# Patient Record
Sex: Male | Born: 1955
Health system: Southern US, Community
[De-identification: ages and names within clinical notes are randomized; demographics above are authoritative.]

## PROBLEM LIST (undated history)

## (undated) ENCOUNTER — Ambulatory Visit: Admission: EM

## (undated) DIAGNOSIS — R011 Cardiac murmur, unspecified: Secondary | ICD-10-CM

## (undated) DIAGNOSIS — Z87438 Personal history of other diseases of male genital organs: Secondary | ICD-10-CM

## (undated) DIAGNOSIS — F419 Anxiety disorder, unspecified: Secondary | ICD-10-CM

## (undated) DIAGNOSIS — C61 Malignant neoplasm of prostate: Secondary | ICD-10-CM

## (undated) DIAGNOSIS — I10 Essential (primary) hypertension: Secondary | ICD-10-CM

## (undated) HISTORY — PX: PROSTATE BIOPSY: SHX241

## (undated) HISTORY — DX: Personal history of other diseases of male genital organs: Z87.438

## (undated) HISTORY — DX: Essential (primary) hypertension: I10

## (undated) HISTORY — PX: TONSILLECTOMY: SUR1361

---

## 1979-04-10 HISTORY — PX: INGUINAL HERNIA REPAIR: SUR1180

## 2004-10-05 ENCOUNTER — Emergency Department (HOSPITAL_COMMUNITY): Admission: EM | Admit: 2004-10-05 | Discharge: 2004-10-05 | Payer: Self-pay | Admitting: Emergency Medicine

## 2013-11-09 ENCOUNTER — Encounter: Payer: Self-pay | Admitting: Radiation Oncology

## 2013-11-09 DIAGNOSIS — Z8546 Personal history of malignant neoplasm of prostate: Secondary | ICD-10-CM | POA: Insufficient documentation

## 2013-11-09 DIAGNOSIS — C61 Malignant neoplasm of prostate: Secondary | ICD-10-CM | POA: Insufficient documentation

## 2013-11-09 NOTE — Progress Notes (Signed)
GU Location of Tumor / Histology: T1cNxMx Gleason 6 prostate cancer  If Prostate Cancer, Gleason Score is (3 + 3) and PSA is (9.6)  Patient presented in 2008 with an elevated PSA of 4.7.  Biopsies of prostate (if applicable) revealed:     Past/Anticipated interventions by urology, if any: discussed RALP, EXRT or Seeds. Patient most interest in seeds.   Past/Anticipated interventions by medical oncology, if any: None  Weight changes, if any: None noted  Bowel/Bladder complaints, if any: IPSS 2, Volume 56 cc, mild erectile dysfunction and minimal voiding symptoms   Nausea/Vomiting, if any: None noted  Pain issues, if any:  None noted  SAFETY ISSUES:  Prior radiation? NO  Pacemaker/ICD? NO  Possible current pregnancy? NO  Is the patient on methotrexate? NO  Current Complaints / other details:  58 year old male. Married. Education officer, museum.

## 2013-11-11 ENCOUNTER — Encounter: Payer: Self-pay | Admitting: Radiation Oncology

## 2013-11-11 NOTE — Progress Notes (Signed)
Radiation Oncology         [phone]) [phone] ________________________________  Initial outpatient Consultation  Name: Alan Johnson MRN: [id number]  Date: 11/12/2013  DOB: 07-Jan-1956  CC:No primary provider on file.  Irine Seal, MD   REFERRING PHYSICIAN: Irine Seal, MD  DIAGNOSIS: 58 y.o. gentleman with stage T1c adenocarcinoma of the prostate with a Gleason's score of 3+3 and a PSA of 9.6  HISTORY OF PRESENT ILLNESS::Alan Johnson is a 58 y.o. gentleman.  He was noted to have an elevated PSA of 5.0 in 2008 by his primary care physician, Dr. Thea Silversmith.  Accordingly, he was referred for evaluation in urology Dr. Jeffie Pollock who recommended biopsy, but the patient did not return for that. His PSA was elevated again at 9.6 with Dr. Thea Silversmith and the patient returned to Dr. Jeffie Pollock on 06/18/13,  digital rectal examination was performed at that time revealing a 3+ gland without nodules.  The patient proceeded to transrectal ultrasound with 12 biopsies of the prostate on 22315.  The prostate volume measured 56 cc.  Out of 12 core biopsies, 8 were positive.  The maximum Gleason score was 3+3, and this was seen in the distribution displayed in the graphic below:   The patient reviewed the biopsy results with his urologist and he has kindly been referred today for discussion of potential radiation treatment options.  PREVIOUS RADIATION THERAPY: No  PAST MEDICAL HISTORY:  has a past medical history of Hypertrophy of prostate without urinary obstruction and other lower urinary tract symptoms (LUTS); Elevated prostate specific antigen (PSA); Malignant neoplasm of prostate; Anxiety state, unspecified; Hematuria; cardiac murmur; Hypertension; and History of prostatitis.    PAST SURGICAL HISTORY: Past Surgical History  Procedure Laterality Date  . Inguinal hernia repair    . Tonsillectomy    . Prostate biopsy      FAMILY HISTORY: family history includes Stroke in his father.  SOCIAL HISTORY:  reports that he has never  smoked. He has never used smokeless tobacco. He reports that he drinks alcohol. He reports that he does not use illicit drugs.  ALLERGIES: Review of patient's allergies indicates no known allergies.  MEDICATIONS:  Current Outpatient Prescriptions  Medication Sig Dispense Refill  . ALPRAZolam (XANAX) 1 MG tablet Take 1 mg by mouth at bedtime as needed for anxiety.      . AMLODIPINE BESYLATE PO Take 5 mg by mouth.       Marland Kitchen aspirin 81 MG tablet Take 81 mg by mouth daily.      . cholecalciferol (VITAMIN D) 1000 UNITS tablet Take 1,000 Units by mouth daily.      Marland Kitchen triamcinolone cream (KENALOG) 0.1 % Apply 1 application topically 2 (two) times daily.       No current facility-administered medications for this encounter.    REVIEW OF SYSTEMS:  A 15 point review of systems is documented in the electronic medical record. This was obtained by the nursing staff. However, I reviewed this with the patient to discuss relevant findings and make appropriate changes.  A comprehensive review of systems was negative..  The patient completed an IPSS and IIEF questionnaire.  His IPSS score was 3 indicating mild urinary outflow obstructive symptoms.  He indicated that his erectile function is able to complete sexual activity on almost all attempts.   PHYSICAL EXAM: This patient is in no acute distress.  He is alert and oriented.   weight is 217 lb 3.2 oz (98.521 kg). His blood pressure is 152/96 and his pulse is 62.  His respiration is 16.  He exhibits no respiratory distress or labored breathing.  He appears neurologically intact.  His mood is pleasant.  His affect is appropriate.  Please note the digital rectal exam findings described above.  KPS = 100   LABORATORY DATA:  PSA in HPI   RADIOGRAPHY: No results found.    IMPRESSION: This gentleman is a 58 y.o. gentleman with stage T1c adenocarcinoma of the prostate with a Gleason's score of 3+3 and a PSA of 9.6.  His T-Stage, Gleason's Score, and PSA put him into  the favorable risk group.  Accordingly he is eligible for a variety of potential treatment options including active surveillance, radical prostatectomy, external beam radiotherapy, or prostate seed implant.  PLAN: Today I reviewed the findings and workup thus far.  We discussed the natural history of prostate cancer.  We reviewed the the implications of T-stage, Gleason's Score, and PSA on decision-making and outcomes in prostate cancer.    We spent time discussing active surveillance in terms of ongoing PSA surveillance and repeat prostate biopsies. I explained this would be a reasonable option for him.  We discussed radiation treatment in the management of prostate cancer with regard to the logistics and delivery of external beam radiation treatment as well as the logistics and delivery of prostate brachytherapy.  We compared and contrasted each of these approaches and also compared these against prostatectomy.  The patient expressed interest in prostate brachytherapy.  I filled out a patient counseling form for him with relevant treatment diagrams and we retained a copy for our records.   The patient would like to proceed with prostate brachytherapy.  I will share my findings with Dr. Jeffie Pollock and move forward with scheduling the procedure in the near future.     I enjoyed meeting with him today, and will look forward to participating in the care of this very nice gentleman.  I spent 60 minutes face to face with the patient and more than 50% of that time was spent in counseling and/or coordination of care.   ------------------------------------------------  Sheral Apley. Tammi Klippel, M.D.

## 2013-11-12 ENCOUNTER — Ambulatory Visit
Admission: RE | Admit: 2013-11-12 | Discharge: 2013-11-12 | Disposition: A | Discharge status: Home / Self Care | Payer: BC Managed Care – PPO | Source: Ambulatory Visit | Attending: Radiation Oncology | Admitting: Radiation Oncology

## 2013-11-12 ENCOUNTER — Encounter: Payer: Self-pay | Admitting: Radiation Oncology

## 2013-11-12 VITALS — BP 152/96 | HR 62 | Resp 16 | Wt 217.2 lb

## 2013-11-12 DIAGNOSIS — I10 Essential (primary) hypertension: Secondary | ICD-10-CM | POA: Insufficient documentation

## 2013-11-12 DIAGNOSIS — C61 Malignant neoplasm of prostate: Secondary | ICD-10-CM

## 2013-11-12 DIAGNOSIS — N400 Enlarged prostate without lower urinary tract symptoms: Secondary | ICD-10-CM | POA: Insufficient documentation

## 2013-11-12 DIAGNOSIS — Z79899 Other long term (current) drug therapy: Secondary | ICD-10-CM | POA: Insufficient documentation

## 2013-11-12 NOTE — Progress Notes (Signed)
IPSS 3. Reports nocturia x1 only if he "drinks a lot before bed." Denies hematuria, urgency or incontinence. States, "I wouldn't even know I had an issue with my prostate if it wasn't for that PSA test." Denies night sweats or weight loss. Denies fatigue. Reports mild ED.

## 2013-11-12 NOTE — Progress Notes (Signed)
See progress note under physician encounter. 

## 2013-11-13 ENCOUNTER — Telehealth: Payer: Self-pay | Admitting: *Deleted

## 2013-11-13 NOTE — Telephone Encounter (Signed)
CALLED PATIENT TO INFORM OF PRE-SEED SCAN FOR 11-16-13, SPOKE WITH PATIENT AND HE IS AWARE OF THIS APPT.

## 2013-11-13 NOTE — Telephone Encounter (Signed)
CALLED PATIENT TO INFORM OF APPT. ON 11-16-13 @ 3:30 PM, NO ANSWER, WILL CALL LATER.

## 2013-11-14 NOTE — Addendum Note (Signed)
Encounter addended by: Heath Tesler Mintz Sowmya Partridge, RN on: 11/14/2013  7:07 PM<BR>     Documentation filed: Charges VN

## 2013-11-15 ENCOUNTER — Other Ambulatory Visit: Payer: Self-pay | Admitting: Urology

## 2013-11-15 ENCOUNTER — Telehealth: Payer: Self-pay | Admitting: *Deleted

## 2013-11-15 NOTE — Progress Notes (Signed)
  Radiation Oncology         [phone] ________________________________  Name: Alan Johnson MRN: [id number]  Date: 11/16/2013  DOB: 09-Nov-1955  SIMULATION AND TREATMENT PLANNING NOTE PUBIC ARCH STUDY  CC:No primary provider on file.  Irine Seal, MD  DIAGNOSIS: 58 y.o. gentleman with stage T1c adenocarcinoma of the prostate with a Gleason's score of 3+3 and a PSA of 9.6  COMPLEX SIMULATION:  The patient presented today for evaluation for possible prostate seed implant. He was brought to the radiation planning suite and placed supine on the CT couch. A 3-dimensional image study set was obtained in upload to the planning computer. There, on each axial slice, I contoured the prostate gland. Then, using three-dimensional radiation planning tools I reconstructed the prostate in view of the structures from the transperineal needle pathway to assess for possible pubic arch interference. In doing so, I did not appreciate any pubic arch interference. Also, the patient's prostate volume was estimated based on the drawn structure. The patient had transrectal ultrasound on 10/01/13 and the prostate volume measured 56 cc The estimated CT volume today was 48 cc.  Given the pubic arch appearance and prostate volume, patient remains a good candidate to proceed with prostate seed implant. Today, he freely provided informed written consent to proceed.    PLAN: The patient will undergo prostate seed implant.   ________________________________  Sheral Apley. Tammi Klippel, M.D.

## 2013-11-15 NOTE — Telephone Encounter (Signed)
Called patient to remind of appt. For 11-16-13, confirmed appt. With patient.

## 2013-11-16 ENCOUNTER — Ambulatory Visit
Admission: RE | Admit: 2013-11-16 | Discharge: 2013-11-16 | Disposition: A | Discharge status: Home / Self Care | Payer: BC Managed Care – PPO | Source: Ambulatory Visit | Attending: Radiation Oncology | Admitting: Radiation Oncology

## 2013-11-16 DIAGNOSIS — C61 Malignant neoplasm of prostate: Secondary | ICD-10-CM

## 2013-11-20 ENCOUNTER — Telehealth: Payer: Self-pay | Admitting: *Deleted

## 2013-11-20 NOTE — Telephone Encounter (Signed)
Called patient to remind of chest x-ray and EKG for 11-21-13, no answer will call later.

## 2013-11-21 ENCOUNTER — Ambulatory Visit (HOSPITAL_BASED_OUTPATIENT_CLINIC_OR_DEPARTMENT_OTHER)
Admission: RE | Admit: 2013-11-21 | Discharge: 2013-11-21 | Disposition: A | Discharge status: Home / Self Care | Payer: BC Managed Care – PPO | Source: Ambulatory Visit | Attending: Urology | Admitting: Urology

## 2013-11-21 ENCOUNTER — Encounter (HOSPITAL_BASED_OUTPATIENT_CLINIC_OR_DEPARTMENT_OTHER)
Admission: RE | Admit: 2013-11-21 | Discharge: 2013-11-21 | Disposition: A | Discharge status: Home / Self Care | Payer: BC Managed Care – PPO | Source: Ambulatory Visit | Attending: Urology | Admitting: Urology

## 2013-11-21 DIAGNOSIS — Z01810 Encounter for preprocedural cardiovascular examination: Secondary | ICD-10-CM | POA: Insufficient documentation

## 2013-11-21 DIAGNOSIS — Z01818 Encounter for other preprocedural examination: Secondary | ICD-10-CM | POA: Insufficient documentation

## 2013-11-21 DIAGNOSIS — Z87891 Personal history of nicotine dependence: Secondary | ICD-10-CM | POA: Insufficient documentation

## 2013-12-19 ENCOUNTER — Telehealth: Payer: Self-pay | Admitting: *Deleted

## 2013-12-19 LAB — CBC
HCT: 41.6 % (ref 39.0–52.0)
Hemoglobin: 14.6 g/dL (ref 13.0–17.0)
MCH: 31.1 pg (ref 26.0–34.0)
MCHC: 35.1 g/dL (ref 30.0–36.0)
MCV: 88.5 fL (ref 78.0–100.0)
PLATELETS: 227 10*3/uL (ref 150–400)
RBC: 4.7 MIL/uL (ref 4.22–5.81)
RDW: 12.8 % (ref 11.5–15.5)
WBC: 6.5 10*3/uL (ref 4.0–10.5)

## 2013-12-19 LAB — PROTIME-INR
INR: 0.93 (ref 0.00–1.49)
Prothrombin Time: 12.3 seconds (ref 11.6–15.2)

## 2013-12-19 LAB — COMPREHENSIVE METABOLIC PANEL
ALBUMIN: 4.1 g/dL (ref 3.5–5.2)
ALK PHOS: 68 U/L (ref 39–117)
ALT: 38 U/L (ref 0–53)
AST: 31 U/L (ref 0–37)
BUN: 13 mg/dL (ref 6–23)
CO2: 25 mEq/L (ref 19–32)
Calcium: 9.8 mg/dL (ref 8.4–10.5)
Chloride: 102 mEq/L (ref 96–112)
Creatinine, Ser: 1.1 mg/dL (ref 0.50–1.35)
GFR calc Af Amer: 86 mL/min — ABNORMAL LOW (ref >90)
GFR calc non Af Amer: 74 mL/min — ABNORMAL LOW (ref >90)
Glucose, Bld: 105 mg/dL — ABNORMAL HIGH (ref 70–99)
POTASSIUM: 4 meq/L (ref 3.7–5.3)
SODIUM: 141 meq/L (ref 137–147)
Total Bilirubin: 0.4 mg/dL (ref 0.3–1.2)
Total Protein: 7.8 g/dL (ref 6.0–8.3)

## 2013-12-19 LAB — APTT: aPTT: 34 seconds (ref 24–37)

## 2013-12-19 NOTE — Telephone Encounter (Signed)
CALLED PATIENT TO REMIND OF LAB FOR 12-20-13, SPOKE WITH PATIENT AND HE IS AWARE OF THIS APPT.

## 2013-12-21 ENCOUNTER — Encounter (HOSPITAL_BASED_OUTPATIENT_CLINIC_OR_DEPARTMENT_OTHER): Payer: Self-pay | Admitting: *Deleted

## 2013-12-24 ENCOUNTER — Encounter (HOSPITAL_BASED_OUTPATIENT_CLINIC_OR_DEPARTMENT_OTHER): Payer: Self-pay | Admitting: *Deleted

## 2013-12-24 NOTE — Progress Notes (Signed)
NPO AFTER MN. ARRIVE AT 0600. CURRENT LAB RESULTS, EKG AND CXR IN EPIC AND CHART.  WILL DO FLEET ENEMA AM DOS.

## 2013-12-26 ENCOUNTER — Telehealth: Payer: Self-pay | Admitting: *Deleted

## 2013-12-26 NOTE — H&P (Signed)
eason For Visit  Seen today for a pre-op H&P.   Active Problems Problems   1. Pre-op exam (V72.84)   Assessed By: Jimmey Ralph (Urology); Last Assessed: 19 Dec 2013  2. Prostate cancer (185)   Assessed By: Jimmey Ralph (Urology); Last Assessed: 19 Dec 2013  History of Present Illness        58 YO male patient of Dr. Ralene Muskrat seen tody for a pre-op H&P.   GU HX:  March 07/2014 prostate biopsy done for a rising PSA of 9.6.  His PSA was 4.7 in 2008.   He was found to have a T1c Nx Mx Gleason 6 prostate cancer in 5/6 left cores with 5-10% involvement and 3/6 right cores with 5-40% involvement.  He has some associated HGPIN and Atypia.   His IPSS is 2 and his SHIM is 22.   He has a CAPRA score of 3 and has low to intermediate risk disease.   His prostate volume is 56cc.   Interval HX:  Today states he is doing well. Denies f/c, cough, SOB, or CP.   Past Medical History Problems   1. History of Anxiety (300.00)  2. History of Hematuria (599.7)  3. History of cardiac murmur (V12.59)  4. History of hypertension (V12.59)  5. History of prostatitis (V13.89)  Surgical History Problems   1. History of Inguinal Hernia Repair  2. History of Tonsillectomy  Current Meds  1. ALPRAZolam TABS;  Therapy: (Recorded:10Nov2014) to Recorded  2. AmLODIPine Besylate 5 MG Oral Tablet;  Therapy: (Recorded:10Nov2014) to Recorded  3. Kenalog 0.1 % CREA;  Therapy: (Recorded:10Nov2014) to Recorded  Allergies Medication   1. No Known Drug Allergies  Family History Problems   1. Family history of Death : Father  2. Family history of Death In The Family Father : Father   CVA     Age of death was 53  3. Family history of Family Health Status Number Of Children   Two sons and one daughter.  4. Family history of Stroke Syndrome (V17.1) : Father  Social History Problems   1. Alcohol use  2. Caffeine Use   One 8 oz drink per day.  3. Married  4. Never smoker  5. Number of children  6.  Occupation:   Education officer, [employer]  Review of Systems Genitourinary, constitutional, skin, eye, otolaryngeal, hematologic/lymphatic, cardiovascular, pulmonary, endocrine, musculoskeletal, gastrointestinal, neurological and psychiatric system(s) were reviewed and pertinent findings if present are noted.    Vitals Vital Signs [Data Includes: Last 1 Day]  Recorded: [id number] 03:55PM  Height: 5 ft 7 in Weight: 191 lb  BMI Calculated: 29.92 BSA Calculated: 1.98 Blood Pressure: 153 / 99 Temperature: 98.3 F Heart Rate: 68  Physical Exam Constitutional: Well nourished and well developed . No acute distress. The patient appears well hydrated.  ENT:. The ears and nose are normal in appearance.  Neck: The appearance of the neck is normal.  Pulmonary: No respiratory distress.  Cardiovascular: Heart rate and rhythm are normal.  Abdomen: The abdomen is obese. The abdomen is soft and nontender. No suprapubic tenderness.  Skin: Normal skin turgor and normal skin color and pigmentation.  Neuro/Psych:. Mood and affect are appropriate.    Results/Data  The following clinical lab reports were reviewed:  UA- negative. Selected Results  UA With REFLEX [id number] 04:03PM Jimmey Ralph  SPECIMEN TYPE: CLEAN CATCH   Test Name Result Flag Reference  COLOR YELLOW  YELLOW  APPEARANCE CLEAR  CLEAR  SPECIFIC GRAVITY 1.015  1.005-1.030  pH 7.0  5.0-8.0  GLUCOSE NEG mg/dL  NEG  BILIRUBIN NEG  NEG  KETONE NEG mg/dL  NEG  BLOOD TRACE A NEG  PROTEIN NEG mg/dL  NEG  UROBILINOGEN 1 mg/dL  0.0-1.0  NITRITE NEG  NEG  LEUKOCYTE ESTERASE NEG  NEG  SQUAMOUS EPITHELIAL/HPF RARE  RARE  WBC NONE SEEN WBC/hpf  <3  RBC 0-2 RBC/hpf  <3  BACTERIA NONE SEEN  RARE  CRYSTALS NONE SEEN  NONE SEEN  CASTS NONE SEEN  NONE SEEN   Assessment Assessed   1. Pre-op exam (V72.84)  2. Prostate cancer (185)  Plan   He is cleared to proceed with upcoming seen implantation with Dr. Jeffie Pollock. Instructed to contact office if he has  any acute changes ie temp >`100.5, cough/congestion, SOB, or CP

## 2013-12-26 NOTE — Anesthesia Preprocedure Evaluation (Addendum)
Anesthesia Evaluation  Patient identified by MRN, date of birth, ID band Patient awake    Reviewed: Allergy & Precautions, H&P , NPO status , Patient's Chart, lab work & pertinent test results  Airway Mallampati: II TM Distance: >3 FB Neck ROM: full    Dental no notable dental hx. (+) Teeth Intact, Dental Advisory Given   Pulmonary neg pulmonary ROS,  breath sounds clear to auscultation  Pulmonary exam normal       Cardiovascular Exercise Tolerance: Good hypertension, Pt. on medications Rhythm:regular Rate:Normal     Neuro/Psych negative neurological ROS  negative psych ROS   GI/Hepatic negative GI ROS, Neg liver ROS,   Endo/Other  negative endocrine ROS  Renal/GU negative Renal ROS  negative genitourinary   Musculoskeletal   Abdominal   Peds  Hematology negative hematology ROS (+)   Anesthesia Other Findings   Reproductive/Obstetrics negative OB ROS                         Anesthesia Physical Anesthesia Plan  ASA: II  Anesthesia Plan: General   Post-op Pain Management:    Induction: Intravenous  Airway Management Planned: LMA  Additional Equipment:   Intra-op Plan:   Post-operative Plan:   Informed Consent: I have reviewed the patients History and Physical, chart, labs and discussed the procedure including the risks, benefits and alternatives for the proposed anesthesia with the patient or authorized representative who has indicated his/her understanding and acceptance.   Dental Advisory Given  Plan Discussed with: CRNA and Surgeon  Anesthesia Plan Comments:         Anesthesia Quick Evaluation

## 2013-12-26 NOTE — Telephone Encounter (Signed)
Called patient to remind of implant on 12-27-13, spoke with patient and he is aware of this procedure.

## 2013-12-27 ENCOUNTER — Encounter (HOSPITAL_BASED_OUTPATIENT_CLINIC_OR_DEPARTMENT_OTHER)
Admission: RE | Disposition: A | Discharge status: Home / Self Care | Payer: Self-pay | Source: Ambulatory Visit | Attending: Urology

## 2013-12-27 ENCOUNTER — Ambulatory Visit (HOSPITAL_COMMUNITY): Payer: BC Managed Care – PPO

## 2013-12-27 ENCOUNTER — Ambulatory Visit (HOSPITAL_BASED_OUTPATIENT_CLINIC_OR_DEPARTMENT_OTHER): Payer: BC Managed Care – PPO | Admitting: Anesthesiology

## 2013-12-27 ENCOUNTER — Encounter (HOSPITAL_BASED_OUTPATIENT_CLINIC_OR_DEPARTMENT_OTHER): Payer: Self-pay | Admitting: *Deleted

## 2013-12-27 ENCOUNTER — Encounter (HOSPITAL_BASED_OUTPATIENT_CLINIC_OR_DEPARTMENT_OTHER): Payer: BC Managed Care – PPO | Admitting: Anesthesiology

## 2013-12-27 ENCOUNTER — Ambulatory Visit (HOSPITAL_BASED_OUTPATIENT_CLINIC_OR_DEPARTMENT_OTHER)
Admission: RE | Admit: 2013-12-27 | Discharge: 2013-12-27 | Disposition: A | Discharge status: Home / Self Care | Payer: BC Managed Care – PPO | Source: Ambulatory Visit | Attending: Urology | Admitting: Urology

## 2013-12-27 DIAGNOSIS — R011 Cardiac murmur, unspecified: Secondary | ICD-10-CM | POA: Insufficient documentation

## 2013-12-27 DIAGNOSIS — Z6829 Body mass index (BMI) 29.0-29.9, adult: Secondary | ICD-10-CM | POA: Insufficient documentation

## 2013-12-27 DIAGNOSIS — F411 Generalized anxiety disorder: Secondary | ICD-10-CM | POA: Insufficient documentation

## 2013-12-27 DIAGNOSIS — E669 Obesity, unspecified: Secondary | ICD-10-CM | POA: Insufficient documentation

## 2013-12-27 DIAGNOSIS — Z79899 Other long term (current) drug therapy: Secondary | ICD-10-CM | POA: Insufficient documentation

## 2013-12-27 DIAGNOSIS — I10 Essential (primary) hypertension: Secondary | ICD-10-CM | POA: Insufficient documentation

## 2013-12-27 DIAGNOSIS — C61 Malignant neoplasm of prostate: Secondary | ICD-10-CM | POA: Insufficient documentation

## 2013-12-27 HISTORY — DX: Anxiety disorder, unspecified: F41.9

## 2013-12-27 HISTORY — DX: Cardiac murmur, unspecified: R01.1

## 2013-12-27 HISTORY — DX: Malignant neoplasm of prostate: C61

## 2013-12-27 HISTORY — PX: RADIOACTIVE SEED IMPLANT: SHX5150

## 2013-12-27 SURGERY — INSERTION, RADIATION SOURCE, PROSTATE
Anesthesia: General | Site: Prostate

## 2013-12-27 MED ORDER — BELLADONNA ALKALOIDS-OPIUM 16.2-60 MG RE SUPP
RECTAL | Status: AC
Start: 2013-12-27 — End: 2013-12-27
  Filled 2013-12-27: qty 1

## 2013-12-27 MED ORDER — CIPROFLOXACIN HCL 500 MG PO TABS: 500.0000 mg | ORAL_TABLET | Freq: Two times a day (BID) | ORAL | Status: DC

## 2013-12-27 MED ORDER — TAMSULOSIN HCL 0.4 MG PO CAPS: 0.4000 mg | ORAL_CAPSULE | Freq: Every day | ORAL | Status: AC

## 2013-12-27 MED ORDER — FENTANYL CITRATE 0.05 MG/ML IJ SOLN
25.0000 ug | INTRAMUSCULAR | Status: DC | PRN
  Filled 2013-12-27: qty 1

## 2013-12-27 MED ORDER — DOCUSATE SODIUM 100 MG PO CAPS: 100.0000 mg | ORAL_CAPSULE | Freq: Two times a day (BID) | ORAL | Status: DC

## 2013-12-27 MED ORDER — LIDOCAINE HCL (CARDIAC) 20 MG/ML IV SOLN
INTRAVENOUS | Status: DC | PRN
  Administered 2013-12-27: 60 mg via INTRAVENOUS

## 2013-12-27 MED ORDER — IOHEXOL 350 MG/ML SOLN
INTRAVENOUS | Status: DC | PRN
  Administered 2013-12-27: 7 mL

## 2013-12-27 MED ORDER — PROPOFOL 10 MG/ML IV BOLUS
INTRAVENOUS | Status: DC | PRN
  Administered 2013-12-27: 180 mg via INTRAVENOUS

## 2013-12-27 MED ORDER — MIDAZOLAM HCL 5 MG/5ML IJ SOLN
INTRAMUSCULAR | Status: DC | PRN
  Administered 2013-12-27: 2 mg via INTRAVENOUS

## 2013-12-27 MED ORDER — CIPROFLOXACIN IN D5W 400 MG/200ML IV SOLN
400.0000 mg | INTRAVENOUS | Status: AC
  Administered 2013-12-27: 400 mg via INTRAVENOUS
  Filled 2013-12-27: qty 200

## 2013-12-27 MED ORDER — ONDANSETRON HCL 4 MG/2ML IJ SOLN
INTRAMUSCULAR | Status: DC | PRN
Start: 2013-12-27 — End: 2013-12-27
  Administered 2013-12-27: 4 mg via INTRAVENOUS

## 2013-12-27 MED ORDER — LACTATED RINGERS IV SOLN
INTRAVENOUS | Status: DC
  Administered 2013-12-27 (×2): via INTRAVENOUS
  Filled 2013-12-27: qty 1000

## 2013-12-27 MED ORDER — STERILE WATER FOR IRRIGATION IR SOLN
Status: DC | PRN
  Administered 2013-12-27: 3 mL

## 2013-12-27 MED ORDER — MIDAZOLAM HCL 2 MG/2ML IJ SOLN
INTRAMUSCULAR | Status: AC
  Filled 2013-12-27: qty 2

## 2013-12-27 MED ORDER — LACTATED RINGERS IV SOLN
INTRAVENOUS | Status: DC
  Filled 2013-12-27: qty 1000

## 2013-12-27 MED ORDER — DEXAMETHASONE SODIUM PHOSPHATE 4 MG/ML IJ SOLN
INTRAMUSCULAR | Status: DC | PRN
  Administered 2013-12-27: 10 mg via INTRAVENOUS

## 2013-12-27 MED ORDER — FENTANYL CITRATE 0.05 MG/ML IJ SOLN
INTRAMUSCULAR | Status: DC | PRN
  Administered 2013-12-27: 25 ug via INTRAVENOUS
  Administered 2013-12-27: 50 ug via INTRAVENOUS
  Administered 2013-12-27: 25 ug via INTRAVENOUS
  Administered 2013-12-27: 50 ug via INTRAVENOUS
  Administered 2013-12-27 (×2): 25 ug via INTRAVENOUS

## 2013-12-27 MED ORDER — HYDROCODONE-ACETAMINOPHEN 5-325 MG PO TABS: 1.0000 | ORAL_TABLET | Freq: Four times a day (QID) | ORAL | Status: DC | PRN

## 2013-12-27 MED ORDER — FENTANYL CITRATE 0.05 MG/ML IJ SOLN
INTRAMUSCULAR | Status: AC
  Filled 2013-12-27: qty 6

## 2013-12-27 MED ORDER — FLEET ENEMA 7-19 GM/118ML RE ENEM
1.0000 | ENEMA | Freq: Once | RECTAL | Status: DC
  Filled 2013-12-27: qty 1

## 2013-12-27 MED ORDER — EPHEDRINE SULFATE 50 MG/ML IJ SOLN
INTRAMUSCULAR | Status: DC | PRN
  Administered 2013-12-27: 10 mg via INTRAVENOUS

## 2013-12-27 SURGICAL SUPPLY — 23 items
BAG URINE DRAINAGE (UROLOGICAL SUPPLIES) ×2 IMPLANT
BLADE SURG ROTATE 9660 (MISCELLANEOUS) ×2 IMPLANT
CATH FOLEY 2WAY SLVR  5CC 16FR (CATHETERS) ×1
CATH FOLEY 2WAY SLVR 5CC 16FR (CATHETERS) ×1 IMPLANT
CATH ROBINSON RED A/P 20FR (CATHETERS) ×2 IMPLANT
CLOTH BEACON ORANGE TIMEOUT ST (SAFETY) ×2 IMPLANT
COVER MAYO STAND STRL (DRAPES) ×2 IMPLANT
COVER TABLE BACK 60X90 (DRAPES) ×2 IMPLANT
DRSG TEGADERM 4X4.75 (GAUZE/BANDAGES/DRESSINGS) ×2 IMPLANT
DRSG TEGADERM 8X12 (GAUZE/BANDAGES/DRESSINGS) ×2 IMPLANT
GLOVE BIO SURGEON STRL SZ7.5 (GLOVE) ×8 IMPLANT
GLOVE ECLIPSE 8.0 STRL XLNG CF (GLOVE) IMPLANT
GLOVE SURG SS PI 8.0 STRL IVOR (GLOVE) ×4 IMPLANT
GOWN PREVENTION PLUS LG XLONG (DISPOSABLE) ×2 IMPLANT
GOWN STRL REIN XL XLG (GOWN DISPOSABLE) ×2 IMPLANT
HOLDER FOLEY CATH W/STRAP (MISCELLANEOUS) ×2 IMPLANT
PACK CYSTOSCOPY (CUSTOM PROCEDURE TRAY) ×2 IMPLANT
SPONGE GAUZE 4X4 12PLY STER LF (GAUZE/BANDAGES/DRESSINGS) ×2 IMPLANT
SYRINGE 10CC LL (SYRINGE) ×2 IMPLANT
UNDERPAD 30X30 INCONTINENT (UNDERPADS AND DIAPERS) ×4 IMPLANT
WATER STERILE IRR 3000ML UROMA (IV SOLUTION) ×2 IMPLANT
WATER STERILE IRR 500ML POUR (IV SOLUTION) ×2 IMPLANT
seed ×166 IMPLANT

## 2013-12-27 NOTE — Discharge Instructions (Addendum)
Post Anesthesia Home Care Instructions  Activity: Get plenty of rest for the remainder of the day. A responsible adult should stay with you for 24 hours following the procedure.  For the next 24 hours, DO NOT: -Drive a car -Paediatric nurse -Drink alcoholic beverages -Take any medication unless instructed by your physician -Make any legal decisions or sign important papers.  Meals: Start with liquid foods such as gelatin or soup. Progress to regular foods as tolerated. Avoid greasy, spicy, heavy foods. If nausea and/or vomiting occur, drink only clear liquids until the nausea and/or vomiting subsides. Call your physician if vomiting continues.  Special Instructions/Symptoms: Your throat may feel dry or sore from the anesthesia or the breathing tube placed in your throat during surgery. If this causes discomfort, gargle with warm salt water. The discomfort should disappear within 24 hours. Brachytherapy for Prostate Cancer, Care After  Refer to this sheet in the next few weeks. These instructions provide you with information on caring for yourself after your procedure. Your health care provider may also give you more specific instructions. Your treatment has been planned according to current medical practices, but problems sometimes occur. Call your health care provider if you have any problems or questions after your procedure. WHAT TO EXPECT AFTER THE PROCEDURE The area behind the scrotum will probably be tender and bruised. For a short period of time you may have:  Difficulty passing urine. You may need a catheter for a few days to a month.  Blood in the urine or semen.  A feeling of constipation because of prostate swelling.  Frequent feeling of an urgent need to urinate. For a long period of time you may have:  Inflammation of the rectum. This happens in about 2% of people who have the procedure.  Erection problems. These vary with age and occur in about 80 40% of  men.  Difficulty urinating. This is caused by scarring in the urethra.  Diarrhea. HOME CARE INSTRUCTIONS   Only take over-the-counter or prescription medicines for pain, discomfort, or fever as directed by your health care provider.  You will probably have a catheter in your bladder for several days. You will have blood in the urine bag and should drink a lot of fluids to keep it a light red color.  Keep all follow-up appointments with your health care provider. If you have a catheter, it will be removed during one of these appointments.  Try not to sit directly on the area behind the scrotum. A soft cushion can decrease the discomfort. Ice packs may also be helpful for the discomfort. Don't put ice directly on the skin.  Shower and wash the area behind the scrotum gently. Do not sit in a tub.  If you have had the brachytherapy that uses the seeds, limit your close contact with children and pregnant women for 2 months because of the radiation still in the prostate. After that period of time the levels drop off quickly. SEEK IMMEDIATE MEDICAL CARE IF:   You have a fever greater than 101F (38.3C).  You have chills.  You have shortness of breath.  You have chest pain.  You have thick blood, like tomato juice, in the urine bag.  Your catheter is blocked so urine can't get into the bag. Your bladder area or lower abdomen may be swollen.  There is excessive bleeding from your rectum. It is normal to have a little blood mixed with your stool.  There is severe discomfort in the treated area  that does not go away with pain medicine.  You have abdominal discomfort.  You have severe nausea or vomiting.  You develop any new or unusual symptoms. Document Released: 08/28/2010 Document Revised: 03/28/2013 Document Reviewed: 01/16/2013 [facility] Patient Information 2014 [place], [place].

## 2013-12-27 NOTE — Op Note (Signed)
PATIENT:  Alan Johnson  PRE-OPERATIVE DIAGNOSIS:  Adenocarcinoma of the prostate  POST-OPERATIVE DIAGNOSIS:  Same  PROCEDURE:  Procedure(s): 1. I-125 radioactive seed implantation 2. Cystoscopy  SURGEON:  Surgeon(s): Irine Seal MD  Radiation oncologist: Dr. Tyler Pita  ANESTHESIA:  General  EBL:  Minimal  DRAINS: None  INDICATION: Alan Johnson is a 58 y.o. with Stage T1c, Gleason 6 prostate cancer who has elected brachytherapy for treatment.  Description of procedure: After informed consent the patient was brought to the major OR, placed on the table and administered general anesthesia. He was then moved to the modified lithotomy position with his perineum perpendicular to the floor. His perineum and genitalia were then sterilely prepped. An official timeout was then performed. A 16 French Foley catheter was then placed in the bladder and filled with dilute contrast, a rectal tube was placed in the rectum and the transrectal ultrasound probe was placed in the rectum and affixed to the stand. He was then sterilely draped.  The sterile grid was installed.   Anchor needles were then placed.   Real time ultrasonography was used along with the seed planning software spot-pro version 3.1-00. This was used to develop the seed plan including the number of needles as well as number of seeds required for complete and adequate coverage. Real-time ultrasonography was then used along with the previously developed plan and the Nucletron device to implant a total of 83 seeds using 26 needles for a target dose of 145 Gy. This proceeded without difficulty or complication.  A Foley catheter was then removed as well as the transrectal ultrasound probe and rectal probe. Flexible cystoscopy was then performed using the 17 French flexible scope which revealed a normal urethra throughout its length down to the sphincter which appeared intact. The prostatic urethra was 3cm with bilobar hyperplasia but no  obstruction. The bladder was then entered and fully and systematically.  The ureteral orifices were noted to be of normal configuration and position. The mucosa revealed no evidence of tumors. There were also no stones identified within the bladder.  No seeds or spacers were seen and/or removed from the bladder.  The cystoscope was then removed.  The drapes were removed.  The perineum was cleaned and dressed.  He was taken out of the lithotomy position and was awakened and taken to recovery room in stable and satisfactory condition. He tolerated procedure well and there were no intraoperative complications.

## 2013-12-27 NOTE — Procedures (Signed)
  Radiation Oncology         [phone] ________________________________  Name: Alan Johnson MRN: [id number]  Date: 12/27/2013  DOB: 08/19/1955       Prostate Seed Implant  WU:[id number],NWGNF L, MD  No ref. provider found  DIAGNOSIS: 58 y.o. gentleman with stage T1c adenocarcinoma of the prostate with a Gleason's score of 3+3 and a PSA of 9.6  PROCEDURE: Insertion of radioactive I-125 seeds into the prostate gland.  RADIATION DOSE: 145 Gy, definitive therapy.  TECHNIQUE: Alan Johnson was brought to the operating room with the urologist. He was placed in the dorsolithotomy position. He was catheterized and a rectal tube was inserted. The perineum was shaved, prepped and draped. The ultrasound probe was then introduced into the rectum to see the prostate gland.  TREATMENT DEVICE: A needle grid was attached to the ultrasound probe stand and anchor needles were placed.  3D PLANNING: The prostate was imaged in 3D using a sagittal sweep of the prostate probe. These images were transferred to the planning computer. There, the prostate, urethra and rectum were defined on each axial reconstructed image. Then, the software created an optimized 3D plan and a few seed positions were adjusted. The quality of the plan was reviewed using [employer] information for the target and the following two organs at risk:  Urethra and Rectum.  Then the accepted plan was uploaded to the seed Selectron afterloading unit.  PROSTATE VOLUME STUDY:  Using transrectal ultrasound the volume of the prostate was verified to be 58.17 cc.  SPECIAL TREATMENT PROCEDURE/SUPERVISION AND HANDLING: The Nucletron FIRST system was used to place the needles under sagittal guidance. A total of 26 needles were used to deposit 83 seeds in the prostate gland. The individual seed activity was 0.546 mCi for a total implant activity of 45.318 mCi.  COMPLEX SIMULATION: At the end of the procedure, an anterior radiograph of the pelvis was obtained to  document seed positioning and count. Cystoscopy was performed to check the urethra and bladder.  MICRODOSIMETRY: At the end of the procedure, the patient was emitting 0.00 mrem/hr at 1 meter. Accordingly, he was considered safe for hospital discharge.  PLAN: The patient will return to the radiation oncology clinic for post implant CT dosimetry in three weeks.   ________________________________  Sheral Apley Tammi Klippel, M.D.

## 2013-12-27 NOTE — Interval H&P Note (Signed)
History and Physical Interval Note:  12/27/2013 7:06 AM  Alan Johnson  has presented today for surgery, with the diagnosis of PRSTATE CANCER  The various methods of treatment have been discussed with the patient and family. After consideration of risks, benefits and other options for treatment, the patient has consented to  Procedure(s) with comments: RADIOACTIVE SEED IMPLANT (N/A) - DR portable as a surgical intervention .  The patient's history has been reviewed, patient examined, no change in status, stable for surgery.  I have reviewed the patient's chart and labs.  Questions were answered to the patient's satisfaction.     Alan Johnson

## 2013-12-27 NOTE — Transfer of Care (Signed)
Immediate Anesthesia Transfer of Care Note  Patient: Alan Johnson  Procedure(s) Performed: Procedure(s) (LRB): RADIOACTIVE SEED IMPLANT (N/A)  Patient Location: PACU  Anesthesia Type: General  Level of Consciousness: awake, oriented, sedated and patient cooperative  Airway & Oxygen Therapy: Patient Spontanous Breathing and Patient connected to face mask oxygen  Post-op Assessment: Report given to PACU RN and Post -op Vital signs reviewed and stable  Post vital signs: Reviewed and stable  Complications: No apparent anesthesia complications

## 2013-12-27 NOTE — Anesthesia Postprocedure Evaluation (Signed)
  Anesthesia Post-op Note  Patient: Alan Johnson  Procedure(s) Performed: Procedure(s) (LRB): RADIOACTIVE SEED IMPLANT (N/A)  Patient Location: PACU  Anesthesia Type: General  Level of Consciousness: awake and alert   Airway and Oxygen Therapy: Patient Spontanous Breathing  Post-op Pain: mild  Post-op Assessment: Post-op Vital signs reviewed, Patient's Cardiovascular Status Stable, Respiratory Function Stable, Patent Airway and No signs of Nausea or vomiting  Last Vitals:  Filed Vitals:   12/27/13 0945  BP: 147/89  Pulse: 81  Temp:   Resp: 21    Post-op Vital Signs: stable   Complications: No apparent anesthesia complications

## 2013-12-27 NOTE — Anesthesia Procedure Notes (Signed)
Procedure Name: LMA Insertion Date/Time: 12/27/2013 7:42 AM Performed by: Denna Haggard D Pre-anesthesia Checklist: Patient identified, Emergency Drugs available, Suction available and Patient being monitored Patient Re-evaluated:Patient Re-evaluated prior to inductionOxygen Delivery Method: Circle System Utilized Preoxygenation: Pre-oxygenation with 100% oxygen Intubation Type: IV induction Ventilation: Mask ventilation without difficulty LMA: LMA inserted LMA Size: 4.0 Number of attempts: 1 Airway Equipment and Method: bite block Placement Confirmation: positive ETCO2 Tube secured with: Tape Dental Injury: Teeth and Oropharynx as per pre-operative assessment

## 2013-12-28 ENCOUNTER — Encounter (HOSPITAL_BASED_OUTPATIENT_CLINIC_OR_DEPARTMENT_OTHER): Payer: Self-pay | Admitting: Urology

## 2014-01-17 ENCOUNTER — Other Ambulatory Visit: Payer: Self-pay | Admitting: Radiation Oncology

## 2014-01-17 ENCOUNTER — Ambulatory Visit: Payer: BC Managed Care – PPO | Admitting: Radiation Oncology

## 2014-01-17 ENCOUNTER — Telehealth: Payer: Self-pay | Admitting: *Deleted

## 2014-01-17 NOTE — Telephone Encounter (Signed)
Called patient to remind of appts. For 01-18-14, spoke with patient and he is aware of these appts.

## 2014-01-18 ENCOUNTER — Ambulatory Visit
Admission: RE | Admit: 2014-01-18 | Discharge: 2014-01-18 | Disposition: A | Discharge status: Home / Self Care | Payer: BC Managed Care – PPO | Source: Ambulatory Visit | Attending: Radiation Oncology | Admitting: Radiation Oncology

## 2014-01-18 ENCOUNTER — Encounter: Payer: Self-pay | Admitting: Radiation Oncology

## 2014-01-18 VITALS — BP 150/94 | HR 77 | Temp 98.4°F | Resp 20

## 2014-01-18 DIAGNOSIS — C61 Malignant neoplasm of prostate: Secondary | ICD-10-CM

## 2014-01-18 NOTE — Progress Notes (Signed)
IPSS today 5. Pt denies pain, urinary, bowel issues, fatigue, loss of appetite. He states the implant "went very well".

## 2014-01-18 NOTE — Progress Notes (Signed)
Radiation Oncology         [phone] ________________________________  Name: Alan Johnson MRN: [id number]  Date: 01/18/2014  DOB: [birth year]  Follow-Up Visit Note  CC: [name], MD  [name], MD  Diagnosis:   58 y.o. gentleman with stage T1c adenocarcinoma of the prostate with a Gleason's score of 3+3 and a PSA of 9.6  Interval Since Last Radiation:  3  months  Narrative:  The patient returns today for routine follow-up.  He is complaining of increased urinary frequency and urinary hesitation symptoms. He filled out a questionnaire regarding urinary function today providing and overall IPSS score of 5 characterizing his symptoms as mild.  His pre-implant score was also low. He denies any bowel symptoms.  ALLERGIES:  has No Known Allergies.  Meds: Current Outpatient Prescriptions  Medication Sig Dispense Refill  . ALPRAZolam (XANAX) 1 MG tablet Take 1 mg by mouth at bedtime as needed for anxiety.      Marland Kitchen amLODipine (NORVASC) 5 MG tablet Take 5 mg by mouth every morning.       Marland Kitchen aspirin 81 MG tablet Take 81 mg by mouth daily.      . cholecalciferol (VITAMIN D) 1000 UNITS tablet Take 1,000 Units by mouth daily.      Marland Kitchen docusate sodium (COLACE) 100 MG capsule Take 1 capsule (100 mg total) by mouth 2 (two) times daily.  60 capsule  2  . tamsulosin (FLOMAX) 0.4 MG CAPS capsule Take 1 capsule (0.4 mg total) by mouth daily.  30 capsule  1  . triamcinolone cream (KENALOG) 0.1 % Apply 1 application topically 2 (two) times daily.       No current facility-administered medications for this encounter.    Physical Findings: The patient is in no acute distress. Patient is alert and oriented.  temperature is 98.4 F (36.9 C). His blood pressure is 150/94 and his pulse is 77. His respiration is 20. Marland Kitchen  No significant changes.  Lab Findings: Lab Results  Component Value Date   WBC 6.5 12/19/2013   HGB 14.6 12/19/2013   HCT 41.6 12/19/2013   MCV 88.5 12/19/2013   PLT 227 12/19/2013     Radiographic Findings:  Patient underwent CT imaging in our clinic for post implant dosimetry. The CT appears to demonstrate an adequate distribution of radioactive seeds throughout the prostate gland. There no seeds in her near the rectum. I suspect the final radiation plan and dosimetry will show appropriate coverage of the prostate gland.   Impression: The patient is recovering from the effects of radiation. His urinary symptoms should gradually improve over the next 4-6 months. We talked about this today. He is encouraged by his improvement already and is otherwise please with his outcome.   Plan: Today, I spent time talking to the patient about his prostate seed implant and resolving urinary symptoms. We also talked about long-term follow-up for prostate cancer following seed implant. He understands that ongoing PSA determinations and digital rectal exams will help perform surveillance to rule out disease recurrence. He understands what to expect with his PSA measures. Patient was also educated today about some of the long-term effects from radiation including a small risk for rectal bleeding and possibly erectile dysfunction. We talked about some of the general management approaches to these potential complications. However, I did encourage the patient to contact our office or return at any point if he has questions or concerns related to his previous radiation and prostate cancer.  _____________________________________  Sheral Apley Tammi Klippel, M.D.

## 2014-01-18 NOTE — Progress Notes (Signed)
  Radiation Oncology         [phone] ________________________________  Name: Alan Johnson MRN: [id number]  Date: 01/18/2014 DOB: April 28, 1956  COMPLEX SIMULATION NOTE  NARRATIVE:  The patient was brought to the [facility] today following prostate seed implantation approximately one month ago.  Identity was confirmed.  All relevant records and images related to the planned course of therapy were reviewed.  Then, the patient was set-up supine.  CT images were obtained.  The CT images were loaded into the planning software.  Then the prostate and rectum were contoured.  Treatment planning then occurred.  The implanted iodine 125 seeds were identified by the physics staff for projection of radiation distribution  I have requested : 3D Simulation  I have requested a DVH of the following structures: Prostate and rectum.    ________________________________  Sheral Apley Tammi Klippel, M.D.

## 2014-03-11 ENCOUNTER — Ambulatory Visit
Admission: RE | Admit: 2014-03-11 | Discharge: 2014-03-11 | Disposition: A | Discharge status: Home / Self Care | Payer: BC Managed Care – PPO | Source: Ambulatory Visit | Attending: Radiation Oncology | Admitting: Radiation Oncology

## 2014-03-11 DIAGNOSIS — C61 Malignant neoplasm of prostate: Secondary | ICD-10-CM | POA: Diagnosis present

## 2014-04-25 ENCOUNTER — Encounter: Payer: Self-pay | Admitting: Radiation Oncology

## 2014-04-25 NOTE — Progress Notes (Signed)
  Radiation Oncology         [phone] ________________________________  Name: Alan Johnson  MRN: [id number]  Date: 03/11/14  DOB: 1956-06-06  Complex Isodose Planning Note Prostate Brachytherapy  Diagnosis: 58 y.o. gentleman with stage T1c adenocarcinoma of the prostate with a Gleason's score of 3+3 and a PSA of 9.6  Narrative: On a previous date, [name] returned following prostate seed implantation for post implant planning. He underwent CT scan complex simulation to delineate the three-dimensional structures of the pelvis and demonstrate the radiation distribution.  Since that time, the seed localization, and complex isodose planning with dose volume histograms have now been completed.  Results:   Prostate Coverage - The dose of radiation delivered to the 90% or more of the prostate gland (D90) was 114.87% of the prescription dose. This exceeds our goal of greater than 90%. Rectal Sparing - The volume of rectal tissue receiving the prescription dose or higher was 0.0 cc. This falls under our thresholds tolerance of 1.0 cc.  Impression: The prostate seed implant appears to show adequate target coverage and appropriate rectal sparing.  Plan:  The patient will continue to follow with urology for ongoing PSA determinations. I would anticipate a high likelihood for local tumor control with minimal risk for rectal morbidity.  ________________________________  Sheral Apley Tammi Klippel, M.D.

## 2014-06-17 ENCOUNTER — Other Ambulatory Visit: Payer: Self-pay | Admitting: Orthopedic Surgery

## 2014-06-17 DIAGNOSIS — M25572 Pain in left ankle and joints of left foot: Secondary | ICD-10-CM

## 2014-06-27 ENCOUNTER — Ambulatory Visit
Admission: RE | Admit: 2014-06-27 | Discharge: 2014-06-27 | Disposition: A | Discharge status: Home / Self Care | Payer: BC Managed Care – PPO | Source: Ambulatory Visit | Attending: Orthopedic Surgery | Admitting: Orthopedic Surgery

## 2014-06-27 DIAGNOSIS — M25572 Pain in left ankle and joints of left foot: Secondary | ICD-10-CM

## 2014-10-04 ENCOUNTER — Other Ambulatory Visit: Payer: Self-pay | Admitting: Orthopedic Surgery

## 2014-10-04 DIAGNOSIS — M545 Low back pain: Secondary | ICD-10-CM

## 2014-10-11 ENCOUNTER — Ambulatory Visit
Admission: RE | Admit: 2014-10-11 | Discharge: 2014-10-11 | Disposition: A | Discharge status: Home / Self Care | Payer: BLUE CROSS/BLUE SHIELD | Source: Ambulatory Visit | Attending: Orthopedic Surgery | Admitting: Orthopedic Surgery

## 2014-10-11 DIAGNOSIS — M545 Low back pain: Secondary | ICD-10-CM

## 2015-05-23 ENCOUNTER — Telehealth: Payer: Self-pay | Admitting: Radiation Oncology

## 2015-05-23 NOTE — Telephone Encounter (Signed)
Patient phoned. Patient verbalized that he understood Dr Tammi Klippel that within 18-24 months he may experience a spike in his PSA. Patient reports his PSA has been trending down until last week. He goes on to say his PSA rose from 3.8 to 4.8. Patient is concerned that this spike was seen only 16 months out. Patient confirms Dr. Eliberto Ivory in [place] is regularly checking his PSA. Patient looking for affirmation that this spike is normal. Routing this message to [facility] for confirmation. Will phone patient back once Dr. Tammi Klippel responds.

## 2015-05-23 NOTE — Telephone Encounter (Signed)
Understand from Alan Johnson the patient has a question for Dr. Tammi Klippel. Phoned patient in an attempt to determine and help answer any questions. No answer. Mailbox full. Unable to leave a message.

## 2015-05-23 NOTE — Telephone Encounter (Signed)
-----   Message from Kerri Perches sent at 05/23/2015  8:54 AM EDT ----- Regarding: phone call Good Morning Dr. Tammi Klippel,   I received a phone call from this patient and he says that he has a question for you.  Mr. Mimbs phone number is [phone].   Thanks,  [employer]

## 2015-05-25 NOTE — Telephone Encounter (Signed)
Let him know that this may be a benign PSA bounce, which is more common in younger patients after prostate seed implant.  It should continue to be closely monitored, but, would not require any treatment at this point.

## 2015-05-26 ENCOUNTER — Telehealth: Payer: Self-pay | Admitting: Radiation Oncology

## 2015-05-26 NOTE — Telephone Encounter (Signed)
Patient phoned. Explained that Dr. Tammi Klippel believes the spike in his PSA is a benign process expected in younger seed implant patients. Encouraged close monitoring. Patient verbalized understanding and expressed his intentions to get a repeat PSA in three months. Patient questioned if Dr. Tammi Klippel could refill his zoloft and xanax. Encouraged patient to obtain these scripts from his PCP. Also, patient requesting a script for Viagra from Dr. Tammi Klippel. Encouraged patient to obtain this script from his urologist, Dr. Eliberto Ivory. Patient verbalized understanding.

## 2015-05-26 NOTE — Telephone Encounter (Signed)
Attempting to reach patient to discuss Dr. Johny Shears thoughts about the spike in his PSA. No answer. Mailbox full thus, unable to leave a message. Will continue to attempt to reach patient.

## 2015-05-27 ENCOUNTER — Telehealth: Payer: Self-pay | Admitting: Radiation Oncology

## 2015-05-27 NOTE — Telephone Encounter (Signed)
Patient returned my call. Explained that after discussing his case with Dr. Tammi Klippel I would like to offer him a follow up. Patient expressed appreciation. He plans to obtain his records from Dr. Eliberto Ivory then, call to make an appointment. Patient expressed he finds comfort in the care provided to him here at [facility].

## 2015-05-27 NOTE — Telephone Encounter (Signed)
Phoned patient. No answer. Mailbox full. Left message requesting return call.

## 2015-06-03 ENCOUNTER — Encounter: Payer: Self-pay | Admitting: *Deleted

## 2015-06-03 ENCOUNTER — Other Ambulatory Visit: Payer: Self-pay | Admitting: *Deleted

## 2015-06-03 DIAGNOSIS — F419 Anxiety disorder, unspecified: Secondary | ICD-10-CM | POA: Insufficient documentation

## 2015-06-03 DIAGNOSIS — I10 Essential (primary) hypertension: Secondary | ICD-10-CM | POA: Insufficient documentation

## 2015-06-04 ENCOUNTER — Ambulatory Visit: Payer: BLUE CROSS/BLUE SHIELD | Admitting: Cardiovascular Disease

## 2015-06-23 ENCOUNTER — Ambulatory Visit (INDEPENDENT_AMBULATORY_CARE_PROVIDER_SITE_OTHER): Payer: BLUE CROSS/BLUE SHIELD | Admitting: Cardiovascular Disease

## 2015-06-23 ENCOUNTER — Encounter: Payer: Self-pay | Admitting: Cardiovascular Disease

## 2015-06-23 VITALS — BP 108/90 | HR 57 | Ht 67.0 in | Wt 205.8 lb

## 2015-06-23 DIAGNOSIS — I10 Essential (primary) hypertension: Secondary | ICD-10-CM | POA: Diagnosis not present

## 2015-06-23 NOTE — Patient Instructions (Addendum)
Medication Instructions:  Your physician recommends that you continue on your current medications as directed. Please refer to the Current Medication list given to you today.   Labwork: None Ordered   Testing/Procedures: None Ordered   Follow-Up: Your physician wants you to follow-up in: 6 months with Dr. Acie Fredrickson.  You will receive a reminder letter in the mail two months in advance. If you don't receive a letter, please call our office to schedule the follow-up appointment.  You have been referred to [facility] @ [place] [phone]  If you need a refill on your cardiac medications before your next appointment, please call your pharmacy.   Thank you for choosing [facility]! Christen Bame, RN [phone]

## 2015-06-23 NOTE — Progress Notes (Signed)
Cardiology Office Note   Date:  06/23/2015   ID:  Alan Johnson, DOB 05/18/1956, MRN [id number]  PCP:  [name], MD  Cardiologist:   Thayer Headings, MD   Chief Complaint  Patient presents with  . Heart Murmur    Problem list 1.  History of prostate cancer 2. Heart murmur 3. Essential Hypertension :    History of Present Illness: Alan Johnson is a 59 y.o. male who presents for evaluation of a heart murmur and to see if it would be safe for him to take Viagra.   No CP or dyspnea.  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   Waks all day long.   Has lost 20 lbs in the past 6 months .   he does all of his normal daily activities without any significant problems. He does all of his yard work without any issues.  Had a stress test with Dr. Linus Salmons [facility]) which was normal .   Hx of heart murmur years ago .     Past Medical History  Diagnosis Date  . Hypertension   . History of prostatitis   . Adenocarcinoma of prostate ([place])     stage T1c  . Anxiety   . Heart murmur     ASYMPTOMATIC    Past Surgical History  Procedure Laterality Date  . Prostate biopsy    . Tonsillectomy  AGE 54'S  . Inguinal hernia repair Left 1980'S  . Radioactive seed implant N/A 12/27/2013    Procedure: RADIOACTIVE SEED IMPLANT;  Surgeon: [name], MD;  Location: [facility];  Service: Urology;  Laterality: N/A;  DR portable     Current Outpatient Prescriptions  Medication Sig Dispense Refill  . ALPRAZolam (XANAX) 1 MG tablet Take 1 mg by mouth at bedtime as needed for anxiety.    Marland Kitchen amLODipine (NORVASC) 5 MG tablet Take 5 mg by mouth every morning.     Marland Kitchen aspirin 81 MG tablet Take 81 mg by mouth daily.    . cholecalciferol (VITAMIN D) 1000 UNITS tablet Take 1,000 Units by mouth daily.    Marland Kitchen docusate sodium (COLACE) 100 MG capsule Take 1 capsule (100 mg total) by mouth 2 (two) times daily. 60 capsule 2  . tamsulosin (FLOMAX) 0.4 MG CAPS capsule Take 1 capsule (0.4 mg  total) by mouth daily. 30 capsule 1   No current facility-administered medications for this visit.    Allergies:   Review of patient's allergies indicates no known allergies.    Social History:  The patient  reports that he has never smoked. He has never used smokeless tobacco. He reports that he drinks alcohol. He reports that he does not use illicit drugs.   Family History:  The patient's family history includes Stroke in his father.    ROS:  Please see the history of present illness.    Review of Systems: Constitutional:  denies fever, chills, diaphoresis, appetite change and fatigue.  HEENT: denies photophobia, eye pain, redness, hearing loss, ear pain, congestion, sore throat, rhinorrhea, sneezing, neck pain, neck stiffness and tinnitus.  Respiratory: denies SOB, DOE, cough, chest tightness, and wheezing.  Cardiovascular: denies chest pain, palpitations and leg swelling.  Gastrointestinal: denies nausea, vomiting, abdominal pain, diarrhea, constipation, blood in stool.  Genitourinary: denies dysuria, urgency, frequency, hematuria, flank pain and difficulty urinating.  Musculoskeletal: denies  myalgias, back pain, joint swelling, arthralgias and gait problem.   Skin: denies pallor, rash and wound.  Neurological: denies dizziness, seizures, syncope,  weakness, light-headedness, numbness and headaches.   Hematological: denies adenopathy, easy bruising, personal or family bleeding history.  Psychiatric/ Behavioral: denies suicidal ideation, mood changes, confusion, nervousness, sleep disturbance and agitation.       All other systems are reviewed and negative.    PHYSICAL EXAM: VS:  BP 108/90 mmHg  Pulse 57  Ht 5\' 7"  (1.702 m)  Wt 205 lb 12.8 oz (93.35 kg)  BMI 32.23 kg/m2 , BMI Body mass index is 32.23 kg/(m^2). GEN: Well nourished, well developed, in no acute distress HEENT: normal Neck: no JVD, carotid bruits, or masses Cardiac:RRR;  1/6  Systolic murmur at LSB , rubs,  gallops,no edema  Respiratory:  clear to auscultation bilaterally, normal work of breathing GI: soft, nontender, nondistended, + BS MS: no deformity or atrophy Skin: warm and dry, no rash Neuro:  Strength and sensation are intact Psych: normal   EKG:  EKG is ordered today. The ekg ordered today demonstrates  Sinus brady at 57.     Recent Labs: No results found for requested labs within last 365 days.    Lipid Panel No results found for: CHOL, TRIG, HDL, CHOLHDL, VLDL, LDLCALC, LDLDIRECT    Wt Readings from Last 3 Encounters:  06/23/15 205 lb 12.8 oz (93.35 kg)  12/27/13 210 lb (95.255 kg)  11/12/13 217 lb 3.2 oz (98.521 kg)      Other studies Reviewed: Additional studies/ records that were reviewed today include: . Review of the above records demonstrates:    ASSESSMENT AND PLAN:  1.  Heart murmur :   Has a soft systolic murmur at the LSB.  It sounds benign.   Possibly due to MR or TR .  at this point I do not have a true clinical indication to do an echocardiogram since he's very stable. The murmur sounds benign. I told him that I would ordered the test if he wanted that at this point I do not think that he needs that extra cost. He exercises regular rate and walks 5-10 miles every day during the course of his work and has never had any significant symptoms.  2. Essential HTN;   His blood pressure is well-controlled. He's currently on amlodipine. He may be able to cut the amlodipine and half as he continues to lose weight.  3. ED :   Offered to give him a script for Sildenifil 20 mg .  He does not want it at this time But will call us back if he decides if he does. He walks many miles in the course of his work day.  He has not had any angina or dyspnea.   He does not need a stress test    Current medicines are reviewed at length with the patient today.  The patient does not have concerns regarding medicines.  The following changes have been made:  no change  Labs/  tests ordered today include:  No orders of the defined types were placed in this encounter.     Disposition:   FU with me in 6 months      Inari Shin, Wonda Cheng, MD  06/23/2015 2:46 PM    [address], [place], [place]  [address] Phone: [phone]; Fax: [phone]   [facility]  [address] [place], [place]  [address] [phone]   Fax [phone]

## 2015-07-24 ENCOUNTER — Telehealth: Payer: Self-pay | Admitting: Radiation Oncology

## 2015-07-24 ENCOUNTER — Other Ambulatory Visit: Payer: Self-pay | Admitting: *Deleted

## 2015-07-24 ENCOUNTER — Telehealth: Payer: Self-pay | Admitting: Cardiovascular Disease

## 2015-07-24 MED ORDER — SILDENAFIL CITRATE 20 MG PO TABS
20.0000 mg | ORAL_TABLET | ORAL | Status: DC | PRN
Start: 2015-07-24 — End: 2015-09-05

## 2015-07-24 NOTE — Telephone Encounter (Signed)
New Message  Pt calling to speak w/ RN concerning his Rx for viagra. Please call back and discuss.

## 2015-07-24 NOTE — Telephone Encounter (Signed)
Floris Pt

## 2015-07-24 NOTE — Telephone Encounter (Signed)
Patient phoned today concerned that he saw scant bright red blood in his semen yesterday. Patient explains he saw scant blood intermittently prior to having seed implanted but, hasn't since the surgery. Patient questioned if a "scan should be done to make sure there is no more cancer." Explained that PSA draws are how "we check to see if the cancer has returned." Patient states, "would you still ask Dr. Tammi Klippel if a scan needs to be done." Patient understands this RN will inform Dr. Tammi Klippel of these findings and call him back.

## 2015-07-24 NOTE — Telephone Encounter (Signed)
REVIEWED OFFICE NOTE  PER  DR NAHSER  MAY FILL  SILDENFIL 20 MG  SEE  MED LIST .Adonis Housekeeper

## 2015-07-25 NOTE — Telephone Encounter (Signed)
Blood in semen is not unusual after seed implant.  No scan needed.  PSA follow-up is important and keeping appointments with urology are important.

## 2015-07-29 ENCOUNTER — Telehealth: Payer: Self-pay | Admitting: Radiation Oncology

## 2015-07-29 NOTE — Telephone Encounter (Signed)
Phoned patient to inform him that per Dr. Tammi Klippel blood in semen is not unusual after seed implant, a scan wasn't needed, and to keeps PSA follow up appointments with urologist. No answer. Mail box full. Will attempt to reach patient later today.

## 2015-08-21 ENCOUNTER — Other Ambulatory Visit: Payer: BLUE CROSS/BLUE SHIELD

## 2015-08-22 ENCOUNTER — Telehealth: Payer: Self-pay | Admitting: Radiation Oncology

## 2015-08-22 NOTE — Telephone Encounter (Addendum)
Patient phoned requesting a CT of his pelvis to prove his cancer hasn't spread. Patient denies any symptoms related to this request. States, "I am worried my cancer will spread." Patient reports he had his PSA drawn two days ago and is waiting for the results. Offered to phone patient's urologist and request PSA results for him. Patient declined the offer. Listened to patient as he discussed his fears and concerns. Attempted to calm and reassure patient. Finally, it was decided the patient will wait for PSA results (expecting a call from urologist Friday) and phone this RN back if need be.

## 2015-08-28 ENCOUNTER — Ambulatory Visit: Payer: BLUE CROSS/BLUE SHIELD | Admitting: Cardiovascular Disease

## 2015-09-02 ENCOUNTER — Ambulatory Visit: Payer: BLUE CROSS/BLUE SHIELD | Admitting: Internal Medicine

## 2015-09-04 ENCOUNTER — Ambulatory Visit: Payer: BLUE CROSS/BLUE SHIELD

## 2015-09-05 ENCOUNTER — Encounter: Payer: Self-pay | Admitting: Cardiovascular Disease

## 2015-09-05 ENCOUNTER — Ambulatory Visit (INDEPENDENT_AMBULATORY_CARE_PROVIDER_SITE_OTHER): Payer: BLUE CROSS/BLUE SHIELD | Admitting: Cardiovascular Disease

## 2015-09-05 VITALS — BP 132/90 | HR 59 | Ht 67.0 in | Wt 208.0 lb

## 2015-09-05 DIAGNOSIS — I10 Essential (primary) hypertension: Secondary | ICD-10-CM

## 2015-09-05 DIAGNOSIS — Z13220 Encounter for screening for lipoid disorders: Secondary | ICD-10-CM

## 2015-09-05 LAB — LIPID PANEL
CHOL/HDL RATIO: 4.3 ratio (ref <=5.0)
CHOLESTEROL: 192 mg/dL (ref 125–200)
HDL: 45 mg/dL (ref >=40)
LDL Cholesterol: 109 mg/dL (ref <130)
Triglycerides: 192 mg/dL — ABNORMAL HIGH (ref <150)
VLDL: 38 mg/dL — ABNORMAL HIGH (ref <30)

## 2015-09-05 LAB — COMPREHENSIVE METABOLIC PANEL
ALBUMIN: 4.3 g/dL (ref 3.6–5.1)
ALT: 21 U/L (ref 9–46)
AST: 22 U/L (ref 10–35)
Alkaline Phosphatase: 57 U/L (ref 40–115)
BILIRUBIN TOTAL: 0.4 mg/dL (ref 0.2–1.2)
BUN: 13 mg/dL (ref 7–25)
CALCIUM: 9.3 mg/dL (ref 8.6–10.3)
CO2: 24 mmol/L (ref 20–31)
Chloride: 105 mmol/L (ref 98–110)
Creat: 0.92 mg/dL (ref 0.70–1.33)
Glucose, Bld: 82 mg/dL (ref 65–99)
Potassium: 3.8 mmol/L (ref 3.5–5.3)
Sodium: 139 mmol/L (ref 135–146)
Total Protein: 7.1 g/dL (ref 6.1–8.1)

## 2015-09-05 MED ORDER — SILDENAFIL CITRATE 20 MG PO TABS: 20.0000 mg | ORAL_TABLET | Freq: Every day | ORAL | Status: DC | PRN

## 2015-09-05 NOTE — Addendum Note (Signed)
Addended by: Emmaline Life on: 09/05/2015 08:11 AM   Modules accepted: Orders

## 2015-09-05 NOTE — Progress Notes (Signed)
Cardiology Office Note   Date:  09/05/2015   ID:  Alan Johnson, DOB 1956-01-23, MRN [id number]  PCP:  Alan Coyer, MD  Cardiologist:   Alan Headings, MD   Chief Complaint  Patient presents with  . Follow-up    htn    Problem list 1.  History of prostate cancer 2. Heart murmur 3. Essential Hypertension :    History of Present Illness: Alan Johnson is a 60 y.o. male who presents for evaluation of a heart murmur and to see if it would be safe for him to take Viagra.   No CP or dyspnea.  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   Alan Johnson all day long.   Has lost 20 lbs in the past 6 months .   he does all of his normal daily activities without any significant problems. He does all of his yard work without any issues.  Had a stress test with Dr. [facility]) which was normal .   Hx of heart murmur years ago .   Sep 05, 2015: Doing well. No CP .  No dyspnea. Exercises regularly  Works at [employer]    Past Medical History  Diagnosis Date  . Hypertension   . History of prostatitis   . Adenocarcinoma of prostate ([place])     stage T1c  . Anxiety   . Heart murmur     ASYMPTOMATIC    Past Surgical History  Procedure Laterality Date  . Prostate biopsy    . Tonsillectomy  AGE 55'S  . Inguinal hernia repair Left 1980'S  . Radioactive seed implant N/A 12/27/2013    Procedure: RADIOACTIVE SEED IMPLANT;  Surgeon: Alan So, MD;  Location: [facility];  Service: Urology;  Laterality: N/A;  DR portable     Current Outpatient Prescriptions  Medication Sig Dispense Refill  . ALPRAZolam (XANAX) 1 MG tablet Take 1 mg by mouth at bedtime as needed for anxiety.    Marland Kitchen amLODipine (NORVASC) 5 MG tablet Take 5 mg by mouth every morning.     Marland Kitchen aspirin 81 MG tablet Take 81 mg by mouth daily.    . sildenafil (REVATIO) 20 MG tablet Take 20 mg by mouth daily as needed. As directed    . tamsulosin (FLOMAX) 0.4 MG CAPS capsule Take 1 capsule (0.4 mg total)  by mouth daily. 30 capsule 1   No current facility-administered medications for this visit.    Allergies:   Review of patient's allergies indicates no known allergies.    Social History:  The patient  reports that he has never smoked. He has never used smokeless tobacco. He reports that he drinks alcohol. He reports that he does not use illicit drugs.   Family History:  The patient's family history includes Stroke in his father.    ROS:  Please see the history of present illness.    Review of Systems: Constitutional:  denies fever, chills, diaphoresis, appetite change and fatigue.  HEENT: denies photophobia, eye pain, redness, hearing loss, ear pain, congestion, sore throat, rhinorrhea, sneezing, neck pain, neck stiffness and tinnitus.  Respiratory: denies SOB, DOE, cough, chest tightness, and wheezing.  Cardiovascular: denies chest pain, palpitations and leg swelling.  Gastrointestinal: denies nausea, vomiting, abdominal pain, diarrhea, constipation, blood in stool.  Genitourinary: denies dysuria, urgency, frequency, hematuria, flank pain and difficulty urinating.  Musculoskeletal: denies  myalgias, back pain, joint swelling, arthralgias and gait problem.   Skin: denies pallor, rash and wound.  Neurological: denies  dizziness, seizures, syncope, weakness, light-headedness, numbness and headaches.   Hematological: denies adenopathy, easy bruising, personal or family bleeding history.  Psychiatric/ Behavioral: denies suicidal ideation, mood changes, confusion, nervousness, sleep disturbance and agitation.       All other systems are reviewed and negative.    PHYSICAL EXAM: VS:  BP 132/90 mmHg  Pulse 59  Ht 5\' 7"  (1.702 m)  Wt 208 lb (94.348 kg)  BMI 32.57 kg/m2  SpO2 98% , BMI Body mass index is 32.57 kg/(m^2). GEN: Well nourished, well developed, in no acute distress HEENT: normal Neck: no JVD, carotid bruits, or masses Cardiac:RRR;  1/6  Systolic murmur at LSB , rubs,  gallops,no edema  Respiratory:  clear to auscultation bilaterally, normal work of breathing GI: soft, nontender, nondistended, + BS MS: no deformity or atrophy Skin: warm and dry, no rash Neuro:  Strength and sensation are intact Psych: normal   EKG:  EKG is not ordered today.    Recent Labs: No results found for requested labs within last 365 days.    Lipid Panel No results found for: CHOL, TRIG, HDL, CHOLHDL, VLDL, LDLCALC, LDLDIRECT    Wt Readings from Last 3 Encounters:  09/05/15 208 lb (94.348 kg)  06/23/15 205 lb 12.8 oz (93.35 kg)  12/27/13 210 lb (95.255 kg)      Other studies Reviewed: Additional studies/ records that were reviewed today include: . Review of the above records demonstrates:    ASSESSMENT AND PLAN:  1.  Heart murmur :   Has a soft systolic murmur at the LSB.  It sounds benign.   Possibly due to MR or TR .  at this point I do not have a true clinical indication to do an echocardiogram since he's very stable. The murmur sounds benign. I told him that I would ordered the test if he wanted that at this point I do not think that he needs that extra cost. He exercises regular rate and walks 5-10 miles every day during the course of his work and has never had any significant symptoms.  2. Essential HTN;   His blood pressure is well-controlled. He's currently on amlodipine. He may be able to cut the amlodipine and half as he continues to lose weight.  3. ED :   Offered to give him a script for Sildenifil 20 mg .    Current medicines are reviewed at length with the patient today.  The patient does not have concerns regarding medicines.  The following changes have been made:  no change  Labs/ tests ordered today include:  No orders of the defined types were placed in this encounter.     Disposition:   FU with me in 1 year       Alan Johnson, Alan Cheng, MD  09/05/2015 7:54 AM    [facility] [place], [place], [place]   [address] Phone: [phone]; Fax: [phone]   [facility]  [address] [place], [place]  [address] [phone]   Fax [phone]

## 2015-09-05 NOTE — Patient Instructions (Signed)
Medication Instructions:  Your physician recommends that you continue on your current medications as directed. Please refer to the Current Medication list given to you today.   Labwork: TODAY - cholesterol, liver, basic metabolic panel   Testing/Procedures: None Ordered   Follow-Up: Your physician wants you to follow-up in: 1 year with Dr. Nahser.  You will receive a reminder letter in the mail two months in advance. If you don't receive a letter, please call our office to schedule the follow-up appointment.   If you need a refill on your cardiac medications before your next appointment, please call your pharmacy.   Thank you for choosing [facility]! Anderson Middlebrooks, RN [phone]   

## 2015-09-08 ENCOUNTER — Telehealth: Payer: Self-pay | Admitting: Cardiovascular Disease

## 2015-09-08 DIAGNOSIS — R011 Cardiac murmur, unspecified: Secondary | ICD-10-CM

## 2015-09-08 NOTE — Telephone Encounter (Signed)
Reviewed lab results with patient who verbalized understanding.  He asks if Dr. Acie Fredrickson has ordered any other tests on him; states he has concerns about sudden cardiac death in his family and would like to prevent any problems that might be caught early.  I advised him that I will discuss with Dr. Acie Fredrickson and call him back with his recommendations.  He verbalized understanding and agreement.

## 2015-09-08 NOTE — Telephone Encounter (Signed)
Echo ordered.  Will call patient to schedule

## 2015-09-08 NOTE — Telephone Encounter (Signed)
There are no tests that I recommend at this time. Follow up with me as scheduled

## 2015-09-08 NOTE — Telephone Encounter (Signed)
Attempted to call patient; no answer, unable to leave voice mail due to mail box full

## 2015-09-08 NOTE — Telephone Encounter (Signed)
Pt wants his lab results from last week please. °

## 2015-09-08 NOTE — Telephone Encounter (Signed)
Pt has a soft systolic murmur and would like to have an echo for complete evaluation since we have never had this evaluated previously . Will get echo

## 2015-09-09 ENCOUNTER — Telehealth: Payer: Self-pay | Admitting: Radiation Oncology

## 2015-09-09 NOTE — Telephone Encounter (Signed)
Patient phoned today adamant that he "must get a CT scan to confirm he doesn't have cancer anywhere else." Tried to reassure patient. Patient insist he must have a CT scan. Will forward request onto Dr. Tammi Klippel and phone patient back with his response.

## 2015-09-09 NOTE — Telephone Encounter (Signed)
Spoke with patient and scheduled him for echo on 2/10.  He thanked me for the call.

## 2015-09-11 ENCOUNTER — Ambulatory Visit: Payer: BLUE CROSS/BLUE SHIELD | Admitting: Family

## 2015-09-11 NOTE — Telephone Encounter (Signed)
I spoke with Dr. Tammi Klippel and he said that he didn't feel that there was a role for a CT scan at this point.

## 2015-09-12 ENCOUNTER — Telehealth: Payer: Self-pay | Admitting: Radiation Oncology

## 2015-09-12 NOTE — Telephone Encounter (Signed)
Phoned patient. Explained Dr. Tammi Klippel doesn't feel like a CT is warranted at this time. Patient reports his PSA did trend down to 3.3 "just like Dr. Tammi Klippel said it would." Offered patient a follow up appointment with Dr. Tammi Klippel to discuss this face to face. Patient denied need for follow up and plans to "try and relax" until his PSA is checked again.

## 2015-09-19 ENCOUNTER — Telehealth: Payer: Self-pay | Admitting: Cardiovascular Disease

## 2015-09-19 ENCOUNTER — Ambulatory Visit (HOSPITAL_COMMUNITY): Payer: BLUE CROSS/BLUE SHIELD | Attending: Internal Medicine

## 2015-09-19 ENCOUNTER — Other Ambulatory Visit: Payer: Self-pay

## 2015-09-19 DIAGNOSIS — Z6831 Body mass index (BMI) 31.0-31.9, adult: Secondary | ICD-10-CM | POA: Diagnosis not present

## 2015-09-19 DIAGNOSIS — E669 Obesity, unspecified: Secondary | ICD-10-CM | POA: Diagnosis not present

## 2015-09-19 DIAGNOSIS — R011 Cardiac murmur, unspecified: Secondary | ICD-10-CM | POA: Diagnosis not present

## 2015-09-19 NOTE — Telephone Encounter (Deleted)
Error

## 2015-10-17 MED FILL — CLOBETASOL 0.05% CREAM: 0.05 | 10 days supply | Qty: 15 | Fill #0

## 2015-10-27 ENCOUNTER — Ambulatory Visit: Payer: BLUE CROSS/BLUE SHIELD | Admitting: Family

## 2015-11-10 ENCOUNTER — Telehealth: Payer: Self-pay | Admitting: Radiation Oncology

## 2015-11-10 NOTE — Telephone Encounter (Signed)
Returned message left by patient. Patient happy to inform this RN his most recent PSA was less than 0.01 "just as Dr. Tammi Klippel said it would be." Congratulated patient. Encouraged patient to continue routine PSA checks and contact this RN with future needs. Patient verbalized understanding.

## 2015-12-09 ENCOUNTER — Encounter: Payer: Self-pay | Admitting: Family

## 2015-12-09 ENCOUNTER — Ambulatory Visit (INDEPENDENT_AMBULATORY_CARE_PROVIDER_SITE_OTHER): Payer: BLUE CROSS/BLUE SHIELD | Admitting: Family

## 2015-12-09 VITALS — BP 138/86 | HR 64 | Temp 98.1°F | Resp 18 | Ht 67.0 in | Wt 213.0 lb

## 2015-12-09 DIAGNOSIS — K0889 Other specified disorders of teeth and supporting structures: Secondary | ICD-10-CM

## 2015-12-09 DIAGNOSIS — G4700 Insomnia, unspecified: Secondary | ICD-10-CM | POA: Diagnosis not present

## 2015-12-09 DIAGNOSIS — F419 Anxiety disorder, unspecified: Secondary | ICD-10-CM | POA: Diagnosis not present

## 2015-12-09 DIAGNOSIS — I10 Essential (primary) hypertension: Secondary | ICD-10-CM

## 2015-12-09 MED ORDER — ZOLPIDEM TARTRATE 5 MG PO TABS: 5.0000 mg | ORAL_TABLET | Freq: Every evening | ORAL | Status: DC | PRN

## 2015-12-09 MED ORDER — ALPRAZOLAM 1 MG PO TABS: 1.0000 mg | ORAL_TABLET | Freq: Two times a day (BID) | ORAL | Status: DC | PRN

## 2015-12-09 MED ORDER — AMLODIPINE BESYLATE 5 MG PO TABS: 5.0000 mg | ORAL_TABLET | Freq: Every morning | ORAL | Status: DC

## 2015-12-09 MED ORDER — HYDROCODONE-ACETAMINOPHEN 5-325 MG PO TABS: 1.0000 | ORAL_TABLET | Freq: Four times a day (QID) | ORAL | Status: DC | PRN

## 2015-12-09 NOTE — Assessment & Plan Note (Signed)
Anxiety appears situational and mainly while driving. Well controlled with Xanax as needed. No adverse side effects. Discussed risks of operating a motor vehicle with medication. Continue current dosage of Xanax. Follow-up if symptoms are no longer controlled.

## 2015-12-09 NOTE — Progress Notes (Signed)
Pre visit review using our clinic review tool, if applicable. No additional management support is needed unless otherwise documented below in the visit note. 

## 2015-12-09 NOTE — Assessment & Plan Note (Addendum)
Several cavities noted upon examination. Has follow-up with dentistry at the end of the month secondary to insurance. Start Vicodin as needed for uncontrolled pain from Tylenol. Follow-up with dentistry as scheduled. [place] controlled substance database reviewed with no irregularities.

## 2015-12-09 NOTE — Progress Notes (Signed)
Subjective:    Patient ID: Alan Johnson, male    DOB: 1956/05/10, 60 y.o.   MRN: [id number]  Chief Complaint  Patient presents with  . Establish Care    refills on xanax and ambien, also would like to see if you would refill hydrocodone for dental pain    HPI:  Alan Johnson is a 60 y.o. male who  has a past medical history of Hypertension; History of prostatitis; Anxiety; Heart murmur; and Adenocarcinoma of prostate ([place]). and presents today for an office visit to establish care.   1.) Anxiety - Previous history of anxiety and occasional panic attack that generally occurs when driving. Overall well managed with current dosage Xanax. Reports taking the medication as prescribed and denies adverse side effects.   2.) Insomnia - Currently managed on the Ambien. Reports taking the medication as prescribed. No adverse side effects. Generally uses the medication on an as needed basis when he works. Generally works 10pm-3am. Does well sleeping on the weekends when he is not at work.  3.) Dental pain - Associated symptom of pain located in his teeth has been going on for about 2 weeks. No trauma. Describes aggravating factors as hot and cold. Modifying factors include an older prescription of Vicodin which has helped with his symptoms. Generally takes 1 tablet every other day as needed.   No Known Allergies   Outpatient Prescriptions Prior to Visit  Medication Sig Dispense Refill  . aspirin 81 MG tablet Take 81 mg by mouth daily.    . sildenafil (REVATIO) 20 MG tablet Take 1 tablet (20 mg total) by mouth daily as needed. As directed 50 tablet 5  . tamsulosin (FLOMAX) 0.4 MG CAPS capsule Take 1 capsule (0.4 mg total) by mouth daily. 30 capsule 1  . ALPRAZolam (XANAX) 1 MG tablet Take 1 mg by mouth at bedtime as needed for anxiety.    Marland Kitchen amLODipine (NORVASC) 5 MG tablet Take 5 mg by mouth every morning.      No facility-administered medications prior to visit.     Past Medical History    Diagnosis Date  . Hypertension   . History of prostatitis   . Anxiety   . Heart murmur     ASYMPTOMATIC  . Adenocarcinoma of prostate [facility])     stage T1c     Past Surgical History  Procedure Laterality Date  . Prostate biopsy    . Tonsillectomy  AGE 55'S  . Inguinal hernia repair Left 1980'S  . Radioactive seed implant N/A 12/27/2013    Procedure: RADIOACTIVE SEED IMPLANT;  Surgeon: [name], MD;  Location: [facility];  Service: Urology;  Laterality: N/A;  DR portable     Family History  Problem Relation Age of Onset  . Stroke Father   . Healthy Mother   . Healthy Maternal Grandmother   . Throat cancer Maternal Grandfather   . Pancreatic cancer Paternal Grandmother      Social History   Social History  . Marital Status: Legally Separated    Spouse Name: N/A  . Number of Children: 3  . Years of Education: 16   Occupational History  . Patient transporter    Social History Main Topics  . Smoking status: Never Smoker   . Smokeless tobacco: Never Used  . Alcohol Use: Yes     Comment: OCCASIONAL  . Drug Use: No  . Sexual Activity: Not on file   Other Topics Concern  . Not on file  Social History Narrative   Fun: Radiographer, therapeutic.      Review of Systems  Constitutional: Negative for fever and chills.  HENT: Positive for dental problem.   Respiratory: Negative for chest tightness and shortness of breath.   Cardiovascular: Negative for chest pain, palpitations and leg swelling.  Psychiatric/Behavioral: Negative for suicidal ideas and sleep disturbance. The patient is not nervous/anxious.       Objective:    BP 138/86 mmHg  Pulse 64  Temp(Src) 98.1 F (36.7 C) (Oral)  Resp 18  Ht 5\' 7"  (1.702 m)  Wt 213 lb (96.616 kg)  BMI 33.35 kg/m2  SpO2 97% Nursing note and vital signs reviewed.  Physical Exam  Constitutional: He is oriented to person, place, and time. He appears well-developed and well-nourished. No distress.  HENT:   Mouth/Throat: No oral lesions. Abnormal dentition. Dental caries present. No dental abscesses.  Cardiovascular: Normal rate, regular rhythm, normal heart sounds and intact distal pulses.   Pulmonary/Chest: Effort normal and breath sounds normal.  Neurological: He is alert and oriented to person, place, and time.  Skin: Skin is warm and dry.  Psychiatric: He has a normal mood and affect. His behavior is normal. Judgment and thought content normal. His mood appears not anxious.       Assessment & Plan:   Problem List Items Addressed This Visit      Cardiovascular and Mediastinum   BP (high blood pressure)   Relevant Medications   amLODipine (NORVASC) 5 MG tablet     Other   Anxiety - Primary    Anxiety appears situational and mainly while driving. Well controlled with Xanax as needed. No adverse side effects. Discussed risks of operating a motor vehicle with medication. Continue current dosage of Xanax. Follow-up if symptoms are no longer controlled.      Relevant Medications   ALPRAZolam (XANAX) 1 MG tablet   Insomnia    Generally sleeps adequately however due to his work schedule has a sleep disturbance pattern and insomnia which is adequately controlled with Ambien as needed. No adverse side effects. Continue current dosage of Ambien.      Relevant Medications   zolpidem (AMBIEN) 5 MG tablet   Pain, dental    Several cavities noted upon examination. Has follow-up with dentistry at the end of the month secondary to insurance. Start Vicodin as needed for uncontrolled pain from Tylenol. Follow-up with dentistry as scheduled. [place] controlled substance database reviewed with no irregularities.      Relevant Medications   HYDROcodone-acetaminophen (NORCO/VICODIN) 5-325 MG tablet       I have changed Mr. Maton ALPRAZolam, amLODipine, and zolpidem. I am also having him start on HYDROcodone-acetaminophen. Additionally, I am having him maintain his aspirin, tamsulosin,  and sildenafil.   Meds ordered this encounter  Medications  . DISCONTD: zolpidem (AMBIEN) 5 MG tablet    Sig: Take 5 mg by mouth at bedtime as needed for sleep.  Marland Kitchen HYDROcodone-acetaminophen (NORCO/VICODIN) 5-325 MG tablet    Sig: Take 1 tablet by mouth every 6 (six) hours as needed for moderate pain.    Dispense:  30 tablet    Refill:  0    Order Specific Question:  Supervising Provider    Answer:  Pricilla Holm A [id number]  . ALPRAZolam (XANAX) 1 MG tablet    Sig: Take 1 tablet (1 mg total) by mouth 2 (two) times daily as needed for anxiety.    Dispense:  30 tablet    Refill:  0  Order Specific Question:  Supervising Provider    Answer:  Pricilla Holm A [id number]  . amLODipine (NORVASC) 5 MG tablet    Sig: Take 1 tablet (5 mg total) by mouth every morning.    Dispense:  90 tablet    Refill:  1    Order Specific Question:  Supervising Provider    Answer:  Pricilla Holm A [id number]  . zolpidem (AMBIEN) 5 MG tablet    Sig: Take 1 tablet (5 mg total) by mouth at bedtime as needed for sleep.    Dispense:  30 tablet    Refill:  0    Order Specific Question:  Supervising Provider    Answer:  Pricilla Holm A [id number]     Follow-up: Return in about 1 month (around 01/09/2016).  Mauricio Po, FNP

## 2015-12-09 NOTE — Assessment & Plan Note (Signed)
Generally sleeps adequately however due to his work schedule has a sleep disturbance pattern and insomnia which is adequately controlled with Ambien as needed. No adverse side effects. Continue current dosage of Ambien.

## 2015-12-09 NOTE — Patient Instructions (Signed)
Thank you for choosing [facility].  Summary/Instructions:  Please continue to take your medications as prescribed.  Follow up with dentistry for your dental pain / cavities.  Your prescription(s) have been submitted to your pharmacy or been printed and provided for you. Please take as directed and contact our office if you believe you are having problem(s) with the medication(s) or have any questions.  If your symptoms worsen or fail to improve, please contact our office for further instruction, or in case of emergency go directly to the emergency room at the closest medical facility.

## 2016-01-13 ENCOUNTER — Ambulatory Visit: Payer: BLUE CROSS/BLUE SHIELD | Admitting: Family

## 2016-01-13 DIAGNOSIS — Z0289 Encounter for other administrative examinations: Secondary | ICD-10-CM

## 2016-01-22 ENCOUNTER — Ambulatory Visit: Payer: BLUE CROSS/BLUE SHIELD | Admitting: Family

## 2016-06-07 ENCOUNTER — Other Ambulatory Visit: Payer: Self-pay | Admitting: Family

## 2016-06-07 DIAGNOSIS — I10 Essential (primary) hypertension: Secondary | ICD-10-CM

## 2016-12-30 DIAGNOSIS — N5231 Erectile dysfunction following radical prostatectomy: Secondary | ICD-10-CM | POA: Insufficient documentation

## 2017-02-06 ENCOUNTER — Other Ambulatory Visit: Payer: Self-pay | Admitting: Family

## 2017-02-06 DIAGNOSIS — I10 Essential (primary) hypertension: Secondary | ICD-10-CM

## 2017-02-10 ENCOUNTER — Other Ambulatory Visit: Payer: Self-pay | Admitting: Family

## 2017-02-10 DIAGNOSIS — I10 Essential (primary) hypertension: Secondary | ICD-10-CM

## 2017-03-18 ENCOUNTER — Telehealth: Payer: Self-pay | Admitting: Radiation Oncology

## 2017-03-18 NOTE — Telephone Encounter (Signed)
Patient left voicemail message requesting return call. Phoned patient back. No answer. Mailbox full thus unable to leave a message.

## 2017-08-11 ENCOUNTER — Ambulatory Visit: Payer: Self-pay | Admitting: Podiatry

## 2017-08-18 ENCOUNTER — Encounter: Payer: Self-pay | Admitting: Podiatry

## 2017-08-18 ENCOUNTER — Ambulatory Visit: Payer: BLUE CROSS/BLUE SHIELD | Admitting: Podiatry

## 2017-08-18 DIAGNOSIS — B353 Tinea pedis: Secondary | ICD-10-CM | POA: Diagnosis not present

## 2017-08-18 DIAGNOSIS — L603 Nail dystrophy: Secondary | ICD-10-CM

## 2017-08-18 NOTE — Progress Notes (Signed)
Subjective:  Patient ID: Alan Johnson, male    DOB: [birth year],  MRN: [id number] HPI Chief Complaint  Patient presents with  . Nail Problem    Hallux nail left - thickened, discolored nail x couple year, has fallen off before and come back darker  . Skin Problem    Interdigital 4th/5th left - macerated area intermittently for years, used creams and powders in the past     62 y.o. male presents with the above complaint.     Past Medical History:  Diagnosis Date  . Adenocarcinoma of prostate ([place])    stage T1c  . Anxiety   . Heart murmur    ASYMPTOMATIC  . History of prostatitis   . Hypertension    Past Surgical History:  Procedure Laterality Date  . INGUINAL HERNIA REPAIR Left 1980'S  . PROSTATE BIOPSY    . RADIOACTIVE SEED IMPLANT N/A 12/27/2013   Procedure: RADIOACTIVE SEED IMPLANT;  Surgeon: [name], MD;  Location: [facility];  Service: Urology;  Laterality: N/A;  DR portable  . TONSILLECTOMY  AGE 18'S    Current Outpatient Medications:  .  tadalafil (CIALIS) 20 MG tablet, Take by mouth., Disp: , Rfl:  .  ALPRAZolam (XANAX) 1 MG tablet, Take 1 tablet (1 mg total) by mouth 2 (two) times daily as needed for anxiety., Disp: 30 tablet, Rfl: 0 .  amLODipine (NORVASC) 5 MG tablet, Take 1 tablet (5 mg total) by mouth every morning. Overdue for annual appt w/labs must see MD for future refills, Disp: 30 tablet, Rfl: 0 .  aspirin 81 MG tablet, Take 81 mg by mouth daily., Disp: , Rfl:  .  dutasteride (AVODART) 0.5 MG capsule, Take 0.5 mg by mouth daily., Disp: , Rfl: 3 .  sildenafil (VIAGRA) 50 MG tablet, use as directed, take 1-5 tablets BY MOUTH AS NEEDED, Disp: , Rfl: 2 .  tamsulosin (FLOMAX) 0.4 MG CAPS capsule, Take 1 capsule (0.4 mg total) by mouth daily., Disp: 30 capsule, Rfl: 1 .  zolpidem (AMBIEN) 5 MG tablet, Take 1 tablet (5 mg total) by mouth at bedtime as needed for sleep., Disp: 30 tablet, Rfl: 0  No Known Allergies Review of Systems  All other  systems reviewed and are negative.  Objective:  There were no vitals filed for this visit.  General: Well developed, nourished, in no acute distress, alert and oriented x3   Dermatological: Skin is warm, dry and supple bilateral. Nails x 10 are well maintained; remaining integument appears unremarkable at this time. There are no open sores, no preulcerative lesions, no rash or signs of infection present.  Macerated fourth webspace with no opening.  There is no skin breakdown.  No signs of bacterial infection in the skin itself.  No cellulitis.  Toenails are thick dystrophic possibly mycotic hallux.  Vascular: Dorsalis Pedis artery and Posterior Tibial artery pedal pulses are 2/4 bilateral with immedate capillary fill time. Pedal hair growth present. No varicosities and no lower extremity edema present bilateral.   Neruologic: Grossly intact via light touch bilateral. Vibratory intact via tuning fork bilateral. Protective threshold with Semmes Wienstein monofilament intact to all pedal sites bilateral. Patellar and Achilles deep tendon reflexes 2+ bilateral. No Babinski or clonus noted bilateral.   Musculoskeletal: No gross boney pedal deformities bilateral. No pain, crepitus, or limitation noted with foot and ankle range of motion bilateral. Muscular strength 5/5 in all groups tested bilateral.  Gait: Unassisted, Nonantalgic.    Radiographs:  None taken  Assessment &  Plan:   Assessment: InterDigital tinea pedis fourth webspace bilateral nail dystrophy hallux bilateral cannot rule out onychomycosis.  Hyperhidrosis  Plan: Samples of the nail were taken today to be sent for pathologic evaluation.  Discussed hyperhidrosis and the use of aluminum chloride     Max T. [place], [place]

## 2017-09-13 DIAGNOSIS — R040 Epistaxis: Secondary | ICD-10-CM | POA: Insufficient documentation

## 2017-09-13 DIAGNOSIS — J069 Acute upper respiratory infection, unspecified: Secondary | ICD-10-CM | POA: Insufficient documentation

## 2017-09-20 ENCOUNTER — Ambulatory Visit: Payer: BLUE CROSS/BLUE SHIELD | Admitting: Podiatry

## 2017-09-29 ENCOUNTER — Ambulatory Visit (INDEPENDENT_AMBULATORY_CARE_PROVIDER_SITE_OTHER): Payer: BLUE CROSS/BLUE SHIELD | Admitting: Podiatry

## 2017-09-29 ENCOUNTER — Encounter: Payer: Self-pay | Admitting: Podiatry

## 2017-09-29 DIAGNOSIS — L603 Nail dystrophy: Secondary | ICD-10-CM | POA: Diagnosis not present

## 2017-09-29 DIAGNOSIS — Z79899 Other long term (current) drug therapy: Secondary | ICD-10-CM | POA: Diagnosis not present

## 2017-09-29 DIAGNOSIS — B353 Tinea pedis: Secondary | ICD-10-CM | POA: Diagnosis not present

## 2017-09-29 MED ORDER — TERBINAFINE HCL 250 MG PO TABS: 250.0000 mg | ORAL_TABLET | Freq: Every day | ORAL | 0 refills | Status: DC

## 2017-09-29 NOTE — Patient Instructions (Signed)

## 2017-10-02 NOTE — Progress Notes (Signed)
He presents today for follow-up of his pathology results as well as the fourth and fifth intermetatarsal interdigital skin breakdown.  Objective: Vital signs are stable alert and oriented x3 pathology was positive for onychomycosis and tinea pedis.  Pulses remain palpable neurologic sensorium is intact deep tendon reflexes are intact no change in the cutis.  Assessment: Onychomycosis tinea pedis.  Plan: Start him on Lamisil 250 mg tablets 1 p.o. daily x30 requested liver profile should this come back abnormal we will notify him immediately.  Otherwise I will follow-up with him in 1 month.  This should help with the breakdown between the toes as well we also discussed alcohol and antiperspirant spray.

## 2017-10-05 LAB — HEPATIC FUNCTION PANEL
AG Ratio: 1.6 (calc) (ref 1.0–2.5)
ALBUMIN MSPROF: 4.3 g/dL (ref 3.6–5.1)
ALT: 25 U/L (ref 9–46)
AST: 20 U/L (ref 10–35)
Alkaline phosphatase (APISO): 56 U/L (ref 40–115)
BILIRUBIN DIRECT: 0.1 mg/dL (ref 0.0–0.2)
BILIRUBIN TOTAL: 0.3 mg/dL (ref 0.2–1.2)
Globulin: 2.7 g/dL (calc) (ref 1.9–3.7)
Indirect Bilirubin: 0.2 mg/dL (calc) (ref 0.2–1.2)
Total Protein: 7 g/dL (ref 6.1–8.1)

## 2017-10-06 ENCOUNTER — Telehealth: Payer: Self-pay | Admitting: *Deleted

## 2017-10-06 NOTE — Telephone Encounter (Signed)
-----   Message from Garrel Ridgel, [place] sent at 10/06/2017  6:56 AM EST ----- Blood work looks perfect and may continue medication.

## 2017-10-06 NOTE — Telephone Encounter (Signed)
I informed pt of Dr. Hyatt's review of results and orders. 

## 2017-10-06 NOTE — Telephone Encounter (Signed)
Pt returned call for results.

## 2017-10-06 NOTE — Telephone Encounter (Signed)
Unable to leave a message pt's mailbox was full.

## 2017-10-27 ENCOUNTER — Ambulatory Visit: Payer: BLUE CROSS/BLUE SHIELD | Admitting: Podiatry

## 2017-11-08 ENCOUNTER — Ambulatory Visit: Payer: BLUE CROSS/BLUE SHIELD | Admitting: Podiatry

## 2017-11-10 ENCOUNTER — Encounter: Payer: Self-pay | Admitting: Podiatry

## 2017-11-10 ENCOUNTER — Ambulatory Visit: Payer: BLUE CROSS/BLUE SHIELD | Admitting: Podiatry

## 2017-11-10 DIAGNOSIS — B353 Tinea pedis: Secondary | ICD-10-CM

## 2017-11-10 MED ORDER — CLINDAMYCIN PHOSPHATE 1 % EX SOLN: Freq: Two times a day (BID) | CUTANEOUS | 2 refills | Status: DC

## 2017-11-10 MED ORDER — TERBINAFINE HCL 250 MG PO TABS: 250.0000 mg | ORAL_TABLET | Freq: Every day | ORAL | 0 refills | Status: DC

## 2017-11-10 NOTE — Progress Notes (Signed)
He presents today for follow-up of his onychomycosis and interdigital tinea pedis to the fourth interdigital space of the left foot.  He states that it seems to be improving to some degree but has not been overwhelming.  He denies any problems taking medicine.  Objective: Vital signs are stable alert and oriented x3.  Pulses are palpable.  Mild maceration between the fourth interdigital space of the left foot appears to be resolving from last visit.  There is been no change in the nail plate hallux left.  Assessment: Onychomycosis and interdigital tinea pedis cannot rule out bacterial infection.  Plan: At this point I am also going to prescribe not only another 90 days of Lamisil after a liver profile.  We are also going to get a topical clindamycin solution to be applied interdigitally.  This should be applied twice a day and instructions were provided for him.  I will follow-up with him in 1 month just to check the interdigital space.  He will continue the use of the Lamisil provided his liver profile is good for another 3 months.

## 2017-12-13 ENCOUNTER — Other Ambulatory Visit: Payer: Self-pay

## 2017-12-13 ENCOUNTER — Ambulatory Visit: Payer: BLUE CROSS/BLUE SHIELD | Admitting: Podiatry

## 2018-01-17 ENCOUNTER — Ambulatory Visit: Payer: BLUE CROSS/BLUE SHIELD | Admitting: Podiatry

## 2018-06-14 DIAGNOSIS — E782 Mixed hyperlipidemia: Secondary | ICD-10-CM | POA: Insufficient documentation

## 2018-06-14 DIAGNOSIS — Z79899 Other long term (current) drug therapy: Secondary | ICD-10-CM | POA: Insufficient documentation

## 2018-08-23 DIAGNOSIS — M778 Other enthesopathies, not elsewhere classified: Secondary | ICD-10-CM | POA: Insufficient documentation

## 2018-08-23 DIAGNOSIS — S43401A Unspecified sprain of right shoulder joint, initial encounter: Secondary | ICD-10-CM | POA: Insufficient documentation

## 2018-08-23 DIAGNOSIS — F411 Generalized anxiety disorder: Secondary | ICD-10-CM | POA: Insufficient documentation

## 2019-01-16 DIAGNOSIS — Z0000 Encounter for general adult medical examination without abnormal findings: Secondary | ICD-10-CM | POA: Insufficient documentation

## 2019-01-30 DIAGNOSIS — L0591 Pilonidal cyst without abscess: Secondary | ICD-10-CM | POA: Insufficient documentation

## 2019-01-30 DIAGNOSIS — Z79899 Other long term (current) drug therapy: Secondary | ICD-10-CM | POA: Insufficient documentation

## 2019-05-22 DIAGNOSIS — Z23 Encounter for immunization: Secondary | ICD-10-CM | POA: Insufficient documentation

## 2019-05-22 DIAGNOSIS — B9689 Other specified bacterial agents as the cause of diseases classified elsewhere: Secondary | ICD-10-CM | POA: Insufficient documentation

## 2019-06-08 ENCOUNTER — Ambulatory Visit: Payer: Self-pay

## 2019-06-08 ENCOUNTER — Other Ambulatory Visit: Payer: Self-pay

## 2019-06-08 ENCOUNTER — Encounter: Payer: Self-pay | Admitting: Orthopedic Surgery

## 2019-06-08 ENCOUNTER — Ambulatory Visit (INDEPENDENT_AMBULATORY_CARE_PROVIDER_SITE_OTHER): Payer: BC Managed Care – PPO | Admitting: Orthopedic Surgery

## 2019-06-08 DIAGNOSIS — M545 Low back pain, unspecified: Secondary | ICD-10-CM

## 2019-06-08 DIAGNOSIS — M25561 Pain in right knee: Secondary | ICD-10-CM | POA: Diagnosis not present

## 2019-06-08 DIAGNOSIS — G8929 Other chronic pain: Secondary | ICD-10-CM | POA: Diagnosis not present

## 2019-06-08 NOTE — Progress Notes (Signed)
Office Visit Note   Patient: Alan Johnson           Date of Birth: [birth year]           MRN: [id number] Visit Date: 06/08/2019 Requested by: Golden Circle, [address],  [place] [address] PCP: Golden Circle, FNP  Subjective: Chief Complaint  Patient presents with  . Lower Back - Pain    HPI: Alan Johnson is a 63 y.o. male who presents to the office complaining of low back pain.  Patient notes pain has been worsening over the past 2 months.  He denies any injury associated with the pain.  He has a history of back problems according to him since he was in the [employer]; he has had no significant back pain up until recently.  Patient notes that pain is worse in the morning when he initially gets out from bed and notes radiation from his back down to his knee is.  He denies any numbness or tingling in his legs or weakness.  He has no history of back surgery or try an ESI's.  He has been taking ibuprofen as needed which somewhat helps with his pain.  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  Patient also notes bilateral knee pain.  He notes that his right knee bothers him more than his left knee.  He denies any injury associated with this pain and states that it really just bothers him after standing for long periods of time at work.  Denies any mechanical symptoms or feelings of instability.  Takes ibuprofen as needed which provides some relief.              ROS:  All systems reviewed are negative as they relate to the chief complaint within the history of present illness.  Patient denies fevers or chills.  Assessment & Plan: Visit Diagnoses:  1. Pain of lumbar spine   2. Chronic pain of right knee     Plan: Patient is a 63 year old male who presents complaining of 2 months axial lumbar back pain.  The majority of pain resides in his axial lumbar spine with only occasional radiation of pain.  He has had a prior MRI in 2016 that showed a small bulging disc  at L5-S1.  X-rays taken today show no significant degenerative changes or any concerns for fracture or spondylolisthesis.  Differential diagnosis includes disc herniation versus facet joint arthritis.  Discussed options available for patient's back pain including physical therapy versus anti-inflammatory medication versus epidural steroid injections.  After discussion, patient decided he will not pursued any treatment currently but he will call the office if he decides he wants to try 1 or any combination of those 3 treatment options.  As for his bilateral knee pain, he has no mechanical symptoms or feelings of instability.  X-rays are negative for any significant degenerative changes or any fracture/dislocation.  Offered to try anti-inflammatory medication versus cortisone injection into the knee joint.  Patient decided to hold off on any such treatment, but he will follow-up with the office if he decides he wants to try treatment.  Patient will follow-up with the office as needed.  Follow-Up Instructions: No follow-ups on file.   Orders:  Orders Placed This Encounter  Procedures  . XR Lumbar Spine 2-3 Views  . XR Knee Complete 4 Views Right   No orders of the defined types were placed in this encounter.  Procedures: No procedures performed   Clinical Data: No additional findings.  Objective: Vital Signs: There were no vitals taken for this visit.  Physical Exam:  Constitutional: Patient appears well-developed HEENT:  Head: Normocephalic Eyes:EOM are normal Neck: Normal range of motion Cardiovascular: Normal rate Pulmonary/chest: Effort normal Neurologic: Patient is alert Skin: Skin is warm Psychiatric: Patient has normal mood and affect  Ortho Exam:  Bilateral knee Exam No effusion bilaterally Extensor mechanism intact No TTP over the lateral jointlines, quad tendon, patellar tendon, pes anserinus, patella, tibial tubercle, LCL/MCL insertions bilaterally TTP over the  medial joint line on the right knee.  No TTP over the medial joint line of the left knee. Stable to varus/valgus stresses.  Stable to anterior/posterior drawer Extension to 0 degrees Flexion > 90 degrees  Negative straight leg raise bilaterally 5/5 motor strength of the bilateral hip flexors, quadriceps, dorsiflexion, plantarflexion, EHL Sensation intact through all dermatomes of the bilateral lower extremities TTP over the axial lumbar spine.  No TTP over the paraspinal muscles Lumbar spine pain exacerbated by extension of the spine. No TTP over the trochanteric bursa bilaterally  Specialty Comments:  No specialty comments available.  Imaging: No results found.   PMFS History: Patient Active Problem List   Diagnosis Date Noted  . Epistaxis 09/13/2017  . Recurrent URI (upper respiratory infection) 09/13/2017  . Erectile dysfunction after radical prostatectomy 12/30/2016  . Insomnia 12/09/2015  . Pain, dental 12/09/2015  . Anxiety 06/03/2015  . BP (high blood pressure) 06/03/2015  . Stage T1c adenocarcinoma of the prostate with a Gleason's score of 3+3 and a PSA of 9.6 11/09/2013   Past Medical History:  Diagnosis Date  . Adenocarcinoma of prostate ([place])    stage T1c  . Anxiety   . Heart murmur    ASYMPTOMATIC  . History of prostatitis   . Hypertension     Family History  Problem Relation Age of Onset  . Stroke Father   . Healthy Mother   . Healthy Maternal Grandmother   . Throat cancer Maternal Grandfather   . Pancreatic cancer Paternal Grandmother     Past Surgical History:  Procedure Laterality Date  . INGUINAL HERNIA REPAIR Left 1980'S  . PROSTATE BIOPSY    . RADIOACTIVE SEED IMPLANT N/A 12/27/2013   Procedure: RADIOACTIVE SEED IMPLANT;  Surgeon: [name], MD;  Location: [facility];  Service: Urology;  Laterality: N/A;  DR portable  . TONSILLECTOMY  AGE 19'S   Social History   Occupational History  . Occupation: Patient transporter   Tobacco Use  . Smoking status: Never Smoker  . Smokeless tobacco: Never Used  Substance and Sexual Activity  . Alcohol use: Yes    Comment: OCCASIONAL  . Drug use: No  . Sexual activity: Not on file

## 2019-06-09 ENCOUNTER — Encounter: Payer: Self-pay | Admitting: Orthopedic Surgery

## 2019-07-03 DIAGNOSIS — G8929 Other chronic pain: Secondary | ICD-10-CM | POA: Insufficient documentation

## 2019-07-03 DIAGNOSIS — M25562 Pain in left knee: Secondary | ICD-10-CM | POA: Insufficient documentation

## 2019-07-24 DIAGNOSIS — L309 Dermatitis, unspecified: Secondary | ICD-10-CM | POA: Insufficient documentation

## 2019-08-01 NOTE — Telephone Encounter (Signed)
error 

## 2019-10-09 ENCOUNTER — Other Ambulatory Visit: Payer: Self-pay

## 2019-10-09 ENCOUNTER — Ambulatory Visit: Payer: BC Managed Care – PPO | Admitting: Family Medicine

## 2019-10-16 ENCOUNTER — Encounter: Payer: Self-pay | Admitting: Family Medicine

## 2019-10-16 ENCOUNTER — Other Ambulatory Visit: Payer: Self-pay

## 2019-10-16 ENCOUNTER — Ambulatory Visit (INDEPENDENT_AMBULATORY_CARE_PROVIDER_SITE_OTHER): Payer: BC Managed Care – PPO | Admitting: Family Medicine

## 2019-10-16 DIAGNOSIS — M25561 Pain in right knee: Secondary | ICD-10-CM | POA: Diagnosis not present

## 2019-10-16 DIAGNOSIS — M25521 Pain in right elbow: Secondary | ICD-10-CM

## 2019-10-16 DIAGNOSIS — G8929 Other chronic pain: Secondary | ICD-10-CM | POA: Diagnosis not present

## 2019-10-16 NOTE — Progress Notes (Signed)
I saw and examined the patient with Dr. Mayer Masker and agree with assessment and plan as outlined.    Nighttime pain in the right anterior elbow, etiology uncertain.  Question nerve impingement of some sort.  We will treat with carpal tunnel night splints for the next 3 to 4 weeks.  If symptoms persist, then nerve studies.  Chronic right knee pain with exam suggesting chondromalacia patella.  He wants to try a cortisone injection in the near future.  If that does not help, then MRI scan.

## 2019-10-16 NOTE — Progress Notes (Signed)
Alan Johnson - 64 y.o. male MRN [id number]  Date of birth: Jan 14, 1956  Office Visit Note: Visit Date: 10/16/2019 PCP: Golden Circle, FNP Referred by: Golden Circle, FNP  Subjective: Chief Complaint  Patient presents with  . Right Knee - Pain    Pain hurts all the time. Chronic pain. Does not feel swollen right now. Knee feels weak at times.  . Right Elbow - Pain    Pain "down in the joint" x 3 months. NKI. Cannot lie on his right side now.   HPI: Alan Johnson is a 64 y.o. male who comes in today for right knee and elbow pain.  Right knee pain- chronic, right worse than left. Describes pain "deep" in knee. Pain is now constant, worse with standing and walking. Sometimes feels weak like it is going to give away. Has been diagnosed with PTF. Tries to take meloxicam sparingly. Has worn a brace in the past but it rolls down and does not fit his knee well. He reports that he has been doing VMO strengthening exercises diligently.  Right elbow pain- For the past 3 months, he has had a pain deep in his elbow at night (points to antecubital fossa). He now cannot lie on his right elbow due to pain. Feels like his arm is "dead" and he has to use his left arm to lift his right hand at night. He gets relief when he puts his arm abducted at 90 or reaching over his head. No pain during the day.    ROS Otherwise per HPI.  Assessment & Plan: Visit Diagnoses:  1. Pain in right elbow   2. Chronic pain of right knee     Plan:  Right knee pain- suspect early PTF arthritis although x-rays show good joint space. Will trial a one time corticosteroid injection. If pain persists, will obtain MRI given his history of prostate cancer.  He will call back to get injection as he recently had dental work done and does not want injection today/  Right elbow pain- description of nighttime pain, numbness with improvement with arm overhead seems neuropathic in origin. Unclear if it is cervical etiology vs  entrapment lower in arm. Full ROM of cervical spine with negative spurlings. No sign of carpal tunnel on exam. Will trial nighttime wrist splints for 3-4 weeks to see if this alleviates symptoms. If no improvement in pain, will obtain nerve studies.  Meds & Orders: No orders of the defined types were placed in this encounter.  No orders of the defined types were placed in this encounter.   Follow-up: No follow-ups on file.   Procedures: No procedures performed  No notes on file   Clinical History: No specialty comments available.   He reports that he has never smoked. He has never used smokeless tobacco. No results for input(s): HGBA1C, LABURIC in the last 8760 hours.  Objective:  VS:  HT:    WT:   BMI:     BP:   HR: bpm  TEMP: ( )  RESP:  Physical Exam  PHYSICAL EXAM: Gen: NAD, alert, cooperative with exam, well-appearing HEENT: clear conjunctiva,  CV:  no edema, capillary refill brisk, normal rate Resp: non-labored Skin: no rashes, normal turgor  Neuro: no gross deficits.  Psych:  alert and oriented  Ortho Exam  Right Elbow: - Inspection: no obvious deformity. No swelling, erythema or bruising - Palpation: No TTP. Biceps tendon intact - ROM: full active ROM in flexion and extension. No crepitus -  Strength: 5/5 strength in wrist flexion and extension without pain. 5/5 strength in biceps, triceps. - Neuro: NV intact distally - Special testing: no laxity with varus/valgus stress, negative milking maneuver. No pain with resisted ECRB or supination. Negative tinel's at radial tunnel Negative tinels over median nerve. Phalen negative.  Right Knee: - Inspection: no gross deformity. No swelling/effusion, erythema or bruising. Skin intact - Palpation: no TTP over knee (reports that pain is "deep") - ROM: full active ROM with flexion and extension in knee and hip (0-120 degrees) - Strength: 5/5 strength - Neuro/vasc: NV intact - Special Tests: - LIGAMENTS: negative anterior  and posterior drawer, no MCL or LCL laxity  -- MENISCUS: negative McMurray's -- PF JOINT: nml patellar mobility bilaterally.   Imaging: No results found.  Past Medical/Family/Surgical/Social History: Medications & Allergies reviewed per EMR, new medications updated. Patient Active Problem List   Diagnosis Date Noted  . Dermatitis 07/24/2019  . Chronic pain of both knees 07/03/2019  . Acute bacterial sinusitis 05/22/2019  . Need for immunization against influenza 05/22/2019  . Controlled substance agreement signed 01/30/2019  . Cyst near coccyx 01/30/2019  . Annual physical exam 01/16/2019  . Generalized anxiety disorder 08/23/2018  . Right elbow tendinitis 08/23/2018  . Sprain of right shoulder 08/23/2018  . Chronic prescription benzodiazepine use 06/14/2018  . Mixed hyperlipidemia 06/14/2018  . Epistaxis 09/13/2017  . Recurrent URI (upper respiratory infection) 09/13/2017  . Erectile dysfunction after radical prostatectomy 12/30/2016  . Insomnia 12/09/2015  . Pain, dental 12/09/2015  . Anxiety 06/03/2015  . BP (high blood pressure) 06/03/2015  . Stage T1c adenocarcinoma of the prostate with a Gleason's score of 3+3 and a PSA of 9.6 11/09/2013   Past Medical History:  Diagnosis Date  . Adenocarcinoma of prostate ([place])    stage T1c  . Anxiety   . Heart murmur    ASYMPTOMATIC  . History of prostatitis   . Hypertension    Family History  Problem Relation Age of Onset  . Stroke Father   . Healthy Mother   . Healthy Maternal Grandmother   . Throat cancer Maternal Grandfather   . Pancreatic cancer Paternal Grandmother    Past Surgical History:  Procedure Laterality Date  . INGUINAL HERNIA REPAIR Left 1980'S  . PROSTATE BIOPSY    . RADIOACTIVE SEED IMPLANT N/A 12/27/2013   Procedure: RADIOACTIVE SEED IMPLANT;  Surgeon: [name], MD;  Location: [facility];  Service: Urology;  Laterality: N/A;  DR portable  . TONSILLECTOMY  AGE 6'S   Social  History   Occupational History  . Occupation: Patient transporter  Tobacco Use  . Smoking status: Never Smoker  . Smokeless tobacco: Never Used  Substance and Sexual Activity  . Alcohol use: Yes    Comment: OCCASIONAL  . Drug use: No  . Sexual activity: Not on file

## 2019-10-17 ENCOUNTER — Ambulatory Visit: Payer: BC Managed Care – PPO | Admitting: Orthopedic Surgery

## 2019-10-18 DIAGNOSIS — Z23 Encounter for immunization: Secondary | ICD-10-CM | POA: Insufficient documentation

## 2020-07-10 DIAGNOSIS — H9201 Otalgia, right ear: Secondary | ICD-10-CM | POA: Insufficient documentation

## 2020-09-18 ENCOUNTER — Ambulatory Visit: Payer: BC Managed Care – PPO | Admitting: Dermatology

## 2020-11-11 DIAGNOSIS — M65311 Trigger thumb, right thumb: Secondary | ICD-10-CM | POA: Insufficient documentation

## 2020-11-11 DIAGNOSIS — M65352 Trigger finger, left little finger: Secondary | ICD-10-CM | POA: Insufficient documentation

## 2021-01-22 ENCOUNTER — Other Ambulatory Visit: Payer: Self-pay

## 2021-01-22 ENCOUNTER — Ambulatory Visit (INDEPENDENT_AMBULATORY_CARE_PROVIDER_SITE_OTHER): Payer: BC Managed Care – PPO

## 2021-01-22 ENCOUNTER — Ambulatory Visit
Admission: EM | Admit: 2021-01-22 | Discharge: 2021-01-22 | Disposition: A | Discharge status: Home / Self Care | Payer: BC Managed Care – PPO | Attending: Emergency Medicine | Admitting: Emergency Medicine

## 2021-01-22 DIAGNOSIS — R1030 Lower abdominal pain, unspecified: Secondary | ICD-10-CM | POA: Diagnosis not present

## 2021-01-22 DIAGNOSIS — S29019A Strain of muscle and tendon of unspecified wall of thorax, initial encounter: Secondary | ICD-10-CM | POA: Diagnosis not present

## 2021-01-22 DIAGNOSIS — S299XXA Unspecified injury of thorax, initial encounter: Secondary | ICD-10-CM | POA: Diagnosis not present

## 2021-01-22 MED ORDER — IBUPROFEN 600 MG PO TABS: 600.0000 mg | ORAL_TABLET | Freq: Four times a day (QID) | ORAL | 0 refills | Status: DC | PRN

## 2021-01-22 NOTE — ED Provider Notes (Signed)
[facility]    CSN: [id number] Arrival date & time: 01/22/21  1556      History   Chief Complaint Chief Complaint  Patient presents with   Rib Injury    HPI Alan Johnson is a 65 y.o. male history of hypertension, prostate cancer, presenting today for evaluation of right rib injury.  Reports approximately 3 weeks ago fell out of bed due to a nightmare and landed on right side.  The following day he was working on a sink and had discomfort in his right lower ribs.  Reports repeat fall approximately 1 week ago from a nightmare as well.  Reports continued pain to right lower back.  He denies any urinary symptoms of hematuria, dysuria, hesitancy.  Reports seeing urologist this past week with normal UA  HPI  Past Medical History:  Diagnosis Date   Adenocarcinoma of prostate ([place])    stage T1c   Anxiety    Heart murmur    ASYMPTOMATIC   History of prostatitis    Hypertension     Patient Active Problem List   Diagnosis Date Noted   Dermatitis 07/24/2019   Chronic pain of both knees 07/03/2019   Acute bacterial sinusitis 05/22/2019   Need for immunization against influenza 05/22/2019   Controlled substance agreement signed 01/30/2019   Cyst near coccyx 01/30/2019   Annual physical exam 01/16/2019   Generalized anxiety disorder 08/23/2018   Right elbow tendinitis 08/23/2018   Sprain of right shoulder 08/23/2018   Chronic prescription benzodiazepine use 06/14/2018   Mixed hyperlipidemia 06/14/2018   Epistaxis 09/13/2017   Recurrent URI (upper respiratory infection) 09/13/2017   Erectile dysfunction after radical prostatectomy 12/30/2016   Insomnia 12/09/2015   Pain, dental 12/09/2015   Anxiety 06/03/2015   BP (high blood pressure) 06/03/2015   Stage T1c adenocarcinoma of the prostate with a Gleason's score of 3+3 and a PSA of 9.6 11/09/2013    Past Surgical History:  Procedure Laterality Date   INGUINAL HERNIA REPAIR Left 1980'S   PROSTATE BIOPSY      RADIOACTIVE SEED IMPLANT N/A 12/27/2013   Procedure: RADIOACTIVE SEED IMPLANT;  Surgeon: [name], MD;  Location: [facility];  Service: Urology;  Laterality: N/A;  DR portable   TONSILLECTOMY  AGE 41'S       Home Medications    Prior to Admission medications   Medication Sig Start Date End Date Taking? Authorizing Provider  ibuprofen (ADVIL) 600 MG tablet Take 1 tablet (600 mg total) by mouth every 6 (six) hours as needed. 01/22/21  Yes Robey Massmann C, PA-C  amLODipine (NORVASC) 5 MG tablet Take 1 tablet (5 mg total) by mouth every morning. Overdue for annual appt w/labs must see MD for future refills 02/10/17   Golden Circle, FNP  aspirin 81 MG tablet Take 81 mg by mouth daily.    [provider]  atorvastatin (LIPITOR) 20 MG tablet Take 20 mg by mouth daily. 04/20/19   [provider]  clotrimazole-betamethasone (LOTRISONE) cream APPLY TO AFFECTED AREA TWICE A DAY 05/22/19   [provider]  dutasteride (AVODART) 0.5 MG capsule Take 0.5 mg by mouth daily. 05/25/17   [provider]  sildenafil (VIAGRA) 50 MG tablet use as directed, take 1-5 tablets BY MOUTH AS NEEDED 08/04/17   [provider]  tadalafil (CIALIS) 20 MG tablet Take by mouth. 12/30/16   [provider]  tamsulosin (FLOMAX) 0.4 MG CAPS capsule Take 1 capsule (0.4 mg total) by mouth  daily. 12/27/13   Irine Seal, MD    Family History Family History  Problem Relation Age of Onset   Stroke Father    Healthy Mother    Healthy Maternal Grandmother    Throat cancer Maternal Grandfather    Pancreatic cancer Paternal Grandmother     Social History Social History   Tobacco Use   Smoking status: Never   Smokeless tobacco: Never  Substance Use Topics   Alcohol use: Yes    Comment: OCCASIONAL   Drug use: No     Allergies   Patient has no known allergies.   Review of Systems Review of Systems  Constitutional:  Negative for activity change,  chills, diaphoresis and fatigue.  HENT:  Negative for ear pain, tinnitus and trouble swallowing.   Eyes:  Negative for photophobia and visual disturbance.  Respiratory:  Negative for cough, chest tightness and shortness of breath.   Cardiovascular:  Positive for chest pain. Negative for leg swelling.  Gastrointestinal:  Negative for abdominal pain, blood in stool, nausea and vomiting.  Musculoskeletal:  Positive for back pain and myalgias. Negative for arthralgias, gait problem, neck pain and neck stiffness.  Skin:  Negative for color change and wound.  Neurological:  Negative for dizziness, weakness, light-headedness, numbness and headaches.    Physical Exam Triage Vital Signs ED Triage Vitals  Enc Vitals Group     BP 01/22/21 1634 (!) 142/79     Pulse --      Resp 01/22/21 1634 18     Temp 01/22/21 1634 98 F (36.7 C)     Temp Source 01/22/21 1634 Oral     SpO2 01/22/21 1634 95 %     Weight --      Height --      Head Circumference --      Peak Flow --      Pain Score 01/22/21 1635 7     Pain Loc --      Pain Edu? --      Excl. in [place]? --    No data found.  Updated Vital Signs BP (!) 142/79 (BP Location: Left Arm)   Temp 98 F (36.7 C) (Oral)   Resp 18   SpO2 95%   Visual Acuity Right Eye Distance:   Left Eye Distance:   Bilateral Distance:    Right Eye Near:   Left Eye Near:    Bilateral Near:     Physical Exam Vitals and nursing note reviewed.  Constitutional:      Appearance: He is well-developed.     Comments: No acute distress  HENT:     Head: Normocephalic and atraumatic.     Nose: Nose normal.  Eyes:     Conjunctiva/sclera: Conjunctivae normal.  Cardiovascular:     Rate and Rhythm: Normal rate and regular rhythm.  Pulmonary:     Effort: Pulmonary effort is normal. No respiratory distress.     Comments: Breathing comfortably at rest, CTABL, no wheezing, rales or other adventitious sounds auscultated  Abdominal:     General: There is no  distension.  Musculoskeletal:        General: Normal range of motion.     Cervical back: Neck supple.     Comments: Tenderness to palpation to far lateral lower thoracic musculature on right extending to right flank, does not extend anteriorly, no palpable step-off, no midline tenderness  Skin:    General: Skin is warm and dry.  Neurological:     Mental Status:  He is alert and oriented to person, place, and time.     UC Treatments / Results  Labs (all labs ordered are listed, but only abnormal results are displayed) Labs Reviewed - No data to display  EKG   Radiology DG Ribs Unilateral W/Chest Right  Result Date: 01/22/2021 CLINICAL DATA:  Fall 3 weeks ago with lower right flank pain. EXAM: RIGHT RIBS AND CHEST - 3+ VIEW COMPARISON:  None. FINDINGS: No fracture or other bone lesions are seen involving the ribs. There is no evidence of pneumothorax or pleural effusion. Both lungs are clear. Heart size and mediastinal contours are within normal limits. IMPRESSION: Negative. Electronically Signed   By: Kerby Moors M.D.   On: 01/22/2021 17:30    Procedures Procedures (including critical care time)  Medications Ordered in UC Medications - No data to display  Initial Impression / Assessment and Plan / UC Course  I have reviewed the triage vital signs and the nursing notes.  Pertinent labs & imaging results that were available during my care of the patient were reviewed by me and considered in my medical decision making (see chart for details).     X-ray negative for any rib fractures, lungs clear, will continue to treat as thoracic strain/rib contusion with anti-inflammatories ice and heat and activity modification, avoid bedrest.Discussed strict return precautions. Patient verbalized understanding and is agreeable with plan.  Final Clinical Impressions(s) / UC Diagnoses   Final diagnoses:  Rib injury  Strain of thoracic region, initial encounter     Discharge  Instructions      X-ray negative for any fractures Continue Tylenol/ibuprofen for pain Alternate ice and heat Gentle stretching and movement Follow-up if not improving or worse      ED Prescriptions     Medication Sig Dispense Auth. Provider   ibuprofen (ADVIL) 600 MG tablet Take 1 tablet (600 mg total) by mouth every 6 (six) hours as needed. 30 tablet Chukwuemeka Artola, Delta C, PA-C      PDMP not reviewed this encounter.   Janith Lima, [place] 01/22/21 1736

## 2021-01-22 NOTE — Discharge Instructions (Addendum)
X-ray negative for any fractures Continue Tylenol/ibuprofen for pain Alternate ice and heat Gentle stretching and movement Follow-up if not improving or worse

## 2021-01-22 NOTE — ED Triage Notes (Signed)
Pt states fell out of the bed 3wks ago and having rt rib pain since. Denies urinary s/sx's.

## 2021-06-09 ENCOUNTER — Telehealth: Payer: Self-pay | Admitting: Orthopedic Surgery

## 2021-06-09 NOTE — Telephone Encounter (Signed)
Received call from patient, needs 2016 MRI Lumbar report faxed to the [place], all previous records have been faxed. I faxed [phone]. Copy is ready for patient to pickup as well.

## 2021-07-22 ENCOUNTER — Ambulatory Visit
Admission: EM | Admit: 2021-07-22 | Discharge: 2021-07-22 | Disposition: A | Discharge status: Home / Self Care | Payer: BC Managed Care – PPO | Attending: Physician Assistant | Admitting: Physician Assistant

## 2021-07-22 ENCOUNTER — Ambulatory Visit (INDEPENDENT_AMBULATORY_CARE_PROVIDER_SITE_OTHER): Payer: BC Managed Care – PPO

## 2021-07-22 ENCOUNTER — Encounter: Payer: Self-pay | Admitting: Emergency Medicine

## 2021-07-22 ENCOUNTER — Other Ambulatory Visit: Payer: Self-pay

## 2021-07-22 DIAGNOSIS — M25562 Pain in left knee: Secondary | ICD-10-CM

## 2021-07-22 DIAGNOSIS — M25561 Pain in right knee: Secondary | ICD-10-CM

## 2021-07-22 DIAGNOSIS — M1711 Unilateral primary osteoarthritis, right knee: Secondary | ICD-10-CM

## 2021-07-22 DIAGNOSIS — M25521 Pain in right elbow: Secondary | ICD-10-CM

## 2021-07-22 MED ORDER — PREDNISONE 20 MG PO TABS: 40.0000 mg | ORAL_TABLET | Freq: Every day | ORAL | 0 refills | Status: AC

## 2021-07-22 NOTE — ED Provider Notes (Signed)
[facility]    CSN: [id number] Arrival date & time: 07/22/21  1320      History   Chief Complaint Chief Complaint  Patient presents with   Joint Pain    HPI Alan Johnson is a 65 y.o. male.   Patient here today for evaluation of bilateral knee pain and right elbow pain.  He reports that right knee pain started a while ago and slowly progressed and then spread to left knee and right elbow.  He reports that he has trouble sleeping due to pain during the night.  He does not report any numbness or tingling.  He does not report any specific treatment for symptoms.  The history is provided by the patient.   Past Medical History:  Diagnosis Date   Adenocarcinoma of prostate ([place])    stage T1c   Anxiety    Heart murmur    ASYMPTOMATIC   History of prostatitis    Hypertension     Patient Active Problem List   Diagnosis Date Noted   Dermatitis 07/24/2019   Chronic pain of both knees 07/03/2019   Acute bacterial sinusitis 05/22/2019   Need for immunization against influenza 05/22/2019   Controlled substance agreement signed 01/30/2019   Cyst near coccyx 01/30/2019   Annual physical exam 01/16/2019   Generalized anxiety disorder 08/23/2018   Right elbow tendinitis 08/23/2018   Sprain of right shoulder 08/23/2018   Chronic prescription benzodiazepine use 06/14/2018   Mixed hyperlipidemia 06/14/2018   Epistaxis 09/13/2017   Recurrent URI (upper respiratory infection) 09/13/2017   Erectile dysfunction after radical prostatectomy 12/30/2016   Insomnia 12/09/2015   Pain, dental 12/09/2015   Anxiety 06/03/2015   BP (high blood pressure) 06/03/2015   Stage T1c adenocarcinoma of the prostate with a Gleason's score of 3+3 and a PSA of 9.6 11/09/2013    Past Surgical History:  Procedure Laterality Date   INGUINAL HERNIA REPAIR Left 1980'S   PROSTATE BIOPSY     RADIOACTIVE SEED IMPLANT N/A 12/27/2013   Procedure: RADIOACTIVE SEED IMPLANT;  Surgeon: [name], MD;   Location: [facility];  Service: Urology;  Laterality: N/A;  DR portable   TONSILLECTOMY  AGE 50'S       Home Medications    Prior to Admission medications   Medication Sig Start Date End Date Taking? Authorizing Provider  predniSONE (DELTASONE) 20 MG tablet Take 2 tablets (40 mg total) by mouth daily with breakfast for 5 days. 07/22/21 07/27/21 Yes Francene Finders, PA-C  amLODipine (NORVASC) 5 MG tablet Take 1 tablet (5 mg total) by mouth every morning. Overdue for annual appt w/labs must see MD for future refills 02/10/17   Golden Circle, FNP  aspirin 81 MG tablet Take 81 mg by mouth daily.    [provider]  atorvastatin (LIPITOR) 20 MG tablet Take 20 mg by mouth daily. 04/20/19   [provider]  clotrimazole-betamethasone (LOTRISONE) cream APPLY TO AFFECTED AREA TWICE A DAY 05/22/19   [provider]  dutasteride (AVODART) 0.5 MG capsule Take 0.5 mg by mouth daily. 05/25/17   [provider]  ibuprofen (ADVIL) 600 MG tablet Take 1 tablet (600 mg total) by mouth every 6 (six) hours as needed. 01/22/21   Wieters, Hallie C, PA-C  sildenafil (VIAGRA) 50 MG tablet use as directed, take 1-5 tablets BY MOUTH AS NEEDED 08/04/17   [provider]  tadalafil (CIALIS) 20 MG tablet Take by mouth. 12/30/16   [provider]  tamsulosin (FLOMAX) 0.4 MG CAPS capsule Take 1 capsule (0.4 mg total) by mouth daily. 12/27/13   Irine Seal, MD    Family History Family History  Problem Relation Age of Onset   Stroke Father    Healthy Mother    Healthy Maternal Grandmother    Throat cancer Maternal Grandfather    Pancreatic cancer Paternal Grandmother     Social History Social History   Tobacco Use   Smoking status: Never   Smokeless tobacco: Never  Substance Use Topics   Alcohol use: Yes    Comment: OCCASIONAL   Drug use: No     Allergies   Patient has no known allergies.   Review of Systems Review of Systems   Constitutional:  Negative for chills and fever.  Eyes:  Negative for discharge and redness.  Musculoskeletal:  Positive for arthralgias. Negative for joint swelling.  Skin:  Positive for color change and wound.  Neurological:  Negative for numbness.    Physical Exam Triage Vital Signs ED Triage Vitals [07/22/21 1532]  Enc Vitals Group     BP (!) 159/91     Pulse Rate (!) 58     Resp 16     Temp 98 F (36.7 C)     Temp Source Oral     SpO2 96 %     Weight      Height      Head Circumference      Peak Flow      Pain Score      Pain Loc      Pain Edu?      Excl. in [place]?    No data found.  Updated Vital Signs BP (!) 159/91 (BP Location: Left Arm)    Pulse (!) 58    Temp 98 F (36.7 C) (Oral)    Resp 16    SpO2 96%      Physical Exam Vitals and nursing note reviewed.  Constitutional:      General: He is not in acute distress.    Appearance: Normal appearance. He is not ill-appearing.  HENT:     Head: Normocephalic and atraumatic.  Eyes:     Conjunctiva/sclera: Conjunctivae normal.  Cardiovascular:     Rate and Rhythm: Normal rate.  Pulmonary:     Effort: Pulmonary effort is normal.  Musculoskeletal:     Comments: Full ROM of right elbow, bilateral knees, normal gait  Neurological:     Mental Status: He is alert.  Psychiatric:        Mood and Affect: Mood normal.        Behavior: Behavior normal.        Thought Content: Thought content normal.     UC Treatments / Results  Labs (all labs ordered are listed, but only abnormal results are displayed) Labs Reviewed  NOVEL CORONAVIRUS, NAA    EKG   Radiology DG Knee Complete 4 Views Right  Result Date: 07/22/2021 CLINICAL DATA:  Pain EXAM: RIGHT KNEE - COMPLETE 4+ VIEW COMPARISON:  06/08/2019 FINDINGS: No recent fracture or dislocation is seen. Minimal bony spurs seen in the medial compartment. There is no significant effusion in the suprapatellar bursa. There is a mixed density structure seen in the  anterior proximal shaft of right tibia has not changed suggesting benign process. IMPRESSION: No recent fracture or dislocation is seen. Degenerative changes are noted with small bony spurs in the medial compartment. Electronically Signed   By: Elmer Picker M.D.   On:  07/22/2021 16:20    Procedures Procedures (including critical care time)  Medications Ordered in UC Medications - No data to display  Initial Impression / Assessment and Plan / UC Course  I have reviewed the triage vital signs and the nursing notes.  Pertinent labs & imaging results that were available during my care of the patient were reviewed by me and considered in my medical decision making (see chart for details).  X-ray ordered of right knee given longest duration of symptoms present to right knee.  Degenerative changes are noted and suspect that most of his symptoms in bilateral knees and elbow are related to osteoarthritis.  We will treat with steroid burst and recommended follow-up with PCP otherwise.  Encouraged follow-up sooner with any further concerns.  Final Clinical Impressions(s) / UC Diagnoses   Final diagnoses:  Primary osteoarthritis of right knee  Acute pain of left knee  Right elbow pain   Discharge Instructions   None    ED Prescriptions     Medication Sig Dispense Auth. Provider   predniSONE (DELTASONE) 20 MG tablet Take 2 tablets (40 mg total) by mouth daily with breakfast for 5 days. 10 tablet Francene Finders, PA-C      PDMP not reviewed this encounter.   Francene Finders, PA-C 07/22/21 1717

## 2021-07-22 NOTE — ED Triage Notes (Signed)
New elbow and knee pain, no injury

## 2021-07-23 ENCOUNTER — Ambulatory Visit (HOSPITAL_BASED_OUTPATIENT_CLINIC_OR_DEPARTMENT_OTHER): Payer: BC Managed Care – PPO | Admitting: Orthopaedic Surgery

## 2021-07-23 LAB — SARS-COV-2, NAA 2 DAY TAT

## 2021-07-23 LAB — NOVEL CORONAVIRUS, NAA: SARS-CoV-2, NAA: NOT DETECTED

## 2021-07-28 ENCOUNTER — Ambulatory Visit: Payer: BC Managed Care – PPO | Admitting: Orthopaedic Surgery

## 2021-07-30 ENCOUNTER — Ambulatory Visit (HOSPITAL_BASED_OUTPATIENT_CLINIC_OR_DEPARTMENT_OTHER): Payer: Self-pay | Admitting: Orthopaedic Surgery

## 2021-08-24 ENCOUNTER — Ambulatory Visit: Payer: Self-pay | Admitting: Orthopedic Surgery

## 2021-09-02 ENCOUNTER — Ambulatory Visit: Payer: Self-pay

## 2021-09-02 ENCOUNTER — Ambulatory Visit (INDEPENDENT_AMBULATORY_CARE_PROVIDER_SITE_OTHER): Payer: Self-pay | Admitting: Surgical

## 2021-09-02 ENCOUNTER — Other Ambulatory Visit: Payer: Self-pay

## 2021-09-02 DIAGNOSIS — M79602 Pain in left arm: Secondary | ICD-10-CM

## 2021-09-02 DIAGNOSIS — M79601 Pain in right arm: Secondary | ICD-10-CM

## 2021-09-02 NOTE — Progress Notes (Addendum)
Office Visit Note   Patient: Alan Johnson           Date of Birth: 06-28-1956           MRN: [id number] Visit Date: 09/02/2021 Requested by: Golden Circle, [address] [place],  [place] [id number] PCP: Golden Circle, FNP  Subjective: Chief Complaint  Patient presents with   Right Knee - Pain   Left Knee - Pain   Left Elbow - Pain   Right Elbow - Pain    HPI: Alan Johnson is a 66 y.o. male who presents to the office complaining of bilateral knee and bilateral elbow pain.  Patient states that he has had symptoms for years but in the last 6 months he has had severe worsening of his bilateral pains.  He describes bilateral knee pain without mechanical symptoms that bothers him most at night.  He also has bilateral elbow pain that he localizes to the anterior aspect of the elbow that also primarily bothers him at night.  He has radiation down from both elbows into the hand.  Denies any numbness or tingling, scapular pain, neck pain.  He does state that pain improves in both elbows with holding his arm overhead at night.  No history of any surgery to his elbows, knees, neck.  Knees occasionally pop.  Do not give way on him or have any mechanical locking symptoms.  Localizes knee pain to the anterior aspect of the knees as well as feeling like a band around the entire knee.  No radiation of pain down the legs.  Denies any groin pain.    He has tried Epsom salts, topical creams without relief.  No history of diabetes.  He does not smoke.  He is currently retired.  Enjoys taking care of his lawn and playing the piano in his free time.  He was recently seen in urgent care who took x-rays of the right knee and gave a prescription for prednisone but he did not take the prednisone.  .                ROS: All systems reviewed are negative as they relate to the chief complaint within the history of present illness.  Patient denies fevers or chills.  Assessment & Plan: Visit Diagnoses:   1. Bilateral arm pain     Plan: Patient is a 66 year old male who presents for evaluation of bilateral elbow, bilateral knee pain.  Radiographs of right knee recently, bilateral elbows today are negative for any significant pathology.  Does have bilateral elbow pain that is severe and out of proportion that keeps him up at night.  No history of injury.  Pain gets better with his arm overhead.  Radiographs of his neck today do demonstrate severe degenerative changes of the cervical spine.  He also has decreased active and passive range of motion of the cervical spine on exam today when he is looking in all 4 directions.  Plan to focus on the bilateral elbow pain and then progressed to evaluate and the knee pain further at his next appointment.  Ordered MRI of the cervical spine for evaluation of bilateral radiculopathy.  Also ordered MRI of the right elbow for evaluation of the biceps tendon given his tenderness over the distal bicep tendon and worsening of pain on exam with resisted supination and bicep flexion.  Follow-up after MRIs to review results.  Follow-Up Instructions: No follow-ups on file.   Orders:  Orders  Placed This Encounter  Procedures   XR Cervical Spine 2 or 3 views   XR Elbow 2 Views Left   XR Elbow 2 Views Right   No orders of the defined types were placed in this encounter.     Procedures: No procedures performed   Clinical Data: No additional findings.  Objective: Vital Signs: There were no vitals taken for this visit.  Physical Exam:  Constitutional: Patient appears well-developed HEENT:  Head: Normocephalic Eyes:EOM are normal Neck: Normal range of motion Cardiovascular: Normal rate Pulmonary/chest: Effort normal Neurologic: Patient is alert Skin: Skin is warm Psychiatric: Patient has normal mood and affect  Ortho Exam: Ortho exam demonstrates bilateral knees without effusion.  0 degrees extension bilaterally.  Greater than 90 degrees of flexion  bilaterally.  No calf tenderness.  Negative Homans' sign.  Mild medial joint line tenderness on the right knee.  Mild lateral joint line tenderness on the left knee.  He has excellent quadricep strength of both knees.  No pain with hip range of motion bilaterally.  No patellofemoral crepitus noted to any significant extent.  Negative patellar grind test bilaterally.  Bilateral elbows with mild tenderness over the distal bicep tendon.  No tenderness over the lateral or medial epicondyles, tricep tendon insertion.  Mild tenderness over the course of the radial nerve bilaterally.  No pain worse with resisted supination, pronation, wrist extension, wrist flexion.  There is a small palpable cord in the right palm in line with the ring finger that has not palpable in the left hand.  Full active passive range of motion of both elbows.  No swelling or ecchymosis noted.  Specialty Comments:  No specialty comments available.  Imaging: No results found.   PMFS History: Patient Active Problem List   Diagnosis Date Noted   Dermatitis 07/24/2019   Chronic pain of both knees 07/03/2019   Acute bacterial sinusitis 05/22/2019   Need for immunization against influenza 05/22/2019   Controlled substance agreement signed 01/30/2019   Cyst near coccyx 01/30/2019   Annual physical exam 01/16/2019   Generalized anxiety disorder 08/23/2018   Right elbow tendinitis 08/23/2018   Sprain of right shoulder 08/23/2018   Chronic prescription benzodiazepine use 06/14/2018   Mixed hyperlipidemia 06/14/2018   Epistaxis 09/13/2017   Recurrent URI (upper respiratory infection) 09/13/2017   Erectile dysfunction after radical prostatectomy 12/30/2016   Insomnia 12/09/2015   Pain, dental 12/09/2015   Anxiety 06/03/2015   BP (high blood pressure) 06/03/2015   Stage T1c adenocarcinoma of the prostate with a Gleason's score of 3+3 and a PSA of 9.6 11/09/2013   Past Medical History:  Diagnosis Date   Adenocarcinoma of  prostate ([place])    stage T1c   Anxiety    Heart murmur    ASYMPTOMATIC   History of prostatitis    Hypertension     Family History  Problem Relation Age of Onset   Stroke Father    Healthy Mother    Healthy Maternal Grandmother    Throat cancer Maternal Grandfather    Pancreatic cancer Paternal Grandmother     Past Surgical History:  Procedure Laterality Date   INGUINAL HERNIA REPAIR Left 1980'S   PROSTATE BIOPSY     RADIOACTIVE SEED IMPLANT N/A 12/27/2013   Procedure: RADIOACTIVE SEED IMPLANT;  Surgeon: [name], MD;  Location: [facility];  Service: Urology;  Laterality: N/A;  DR portable   TONSILLECTOMY  AGE 80'S   Social History   Occupational History   Occupation:  Patient transporter  Tobacco Use   Smoking status: Never   Smokeless tobacco: Never  Substance and Sexual Activity   Alcohol use: Yes    Comment: OCCASIONAL   Drug use: No   Sexual activity: Not on file

## 2021-09-06 ENCOUNTER — Encounter: Payer: Self-pay | Admitting: Orthopedic Surgery

## 2021-09-10 ENCOUNTER — Telehealth: Payer: Self-pay | Admitting: *Deleted

## 2021-09-10 NOTE — Telephone Encounter (Signed)
Received call from [facility] in [place] needing MRI Cervical and R elbow faxed over to them to schedule pt. Orders were faxed to [phone]

## 2021-10-01 ENCOUNTER — Telehealth: Payer: Self-pay | Admitting: *Deleted

## 2021-10-01 NOTE — Telephone Encounter (Signed)
Pt called stating he had received a call from [facility] in [place] and wanted to know why he was scheduled there when he lives in [place], and that also [facility] has been calling him to get scheduled as well. I spoke with pt and told him I had received a call from [facility] requesting orders to be faxed to them so they can get him scheduled. Pt called [facility] and told them to take him off the appt list and now wants to be sent to [facility] imaging.   Pt informed he his insurance information as changed and will be next week before he will get the card along with the VA benefits and that he will bring to office so they can get scanned and let me know that it has been done and then I can proceed with getting him scheduled with [facility].

## 2021-10-13 ENCOUNTER — Other Ambulatory Visit: Payer: Self-pay

## 2021-10-13 ENCOUNTER — Encounter: Payer: Self-pay | Admitting: Podiatry

## 2021-10-13 ENCOUNTER — Telehealth: Payer: Self-pay | Admitting: Podiatry

## 2021-10-13 ENCOUNTER — Ambulatory Visit (INDEPENDENT_AMBULATORY_CARE_PROVIDER_SITE_OTHER): Payer: BC Managed Care – PPO | Admitting: Podiatry

## 2021-10-13 DIAGNOSIS — B353 Tinea pedis: Secondary | ICD-10-CM

## 2021-10-13 DIAGNOSIS — L600 Ingrowing nail: Secondary | ICD-10-CM

## 2021-10-13 DIAGNOSIS — N400 Enlarged prostate without lower urinary tract symptoms: Secondary | ICD-10-CM | POA: Insufficient documentation

## 2021-10-13 MED ORDER — ECONAZOLE NITRATE 1 % EX CREA: TOPICAL_CREAM | Freq: Two times a day (BID) | CUTANEOUS | 2 refills | Status: DC

## 2021-10-13 MED ORDER — NEOMYCIN-POLYMYXIN-HC 1 % OT SOLN: OTIC | 1 refills | Status: DC

## 2021-10-13 NOTE — Telephone Encounter (Signed)
Pt is calling about his medications -  ? ?NEOMYCIN-POLYMYXIN-HYDROCORTISONE (CORTISPORIN) 1 % SOLN OTIC solution ? ?econazole nitrate 1 % cream  ? ?They are currently not at pharmacy -  ? ?[facility]  ?[place]  ?[place] [id number]  ? ?Could these be resent to pharmacy and pt will pick them up tomorrow.  ? ?Please advise ?

## 2021-10-13 NOTE — Patient Instructions (Signed)

## 2021-10-13 NOTE — Progress Notes (Signed)
?Subjective:  ?Patient ID: Alan Johnson, male    DOB: 26-Jan-1956,  MRN: [id number] ?HPI ?Chief Complaint  ?Patient presents with  ? Toe Pain  ?  Hallux left - medial border, stumped toe getting out of bed twice, very tender, infection starting  ? Skin Problem  ?  Follow up tinea - would like Rx cream for between toes, couldn't take oral meds  ? New Patient (Initial Visit)  ?  Est pt 2019  ? ? ?66 y.o. male presents with the above complaint.  ? ?ROS: Denies fever chills nausea vomiting muscle aches pains calf pain back pain chest pain shortness of breath. ? ?Past Medical History:  ?Diagnosis Date  ? Adenocarcinoma of prostate ([place])   ? stage T1c  ? Anxiety   ? Heart murmur   ? ASYMPTOMATIC  ? History of prostatitis   ? Hypertension   ? ?Past Surgical History:  ?Procedure Laterality Date  ? INGUINAL HERNIA REPAIR Left 1980'S  ? PROSTATE BIOPSY    ? RADIOACTIVE SEED IMPLANT N/A 12/27/2013  ? Procedure: RADIOACTIVE SEED IMPLANT;  Surgeon: [name], MD;  Location: [facility];  Service: Urology;  Laterality: N/A;  DR portable  ? TONSILLECTOMY  AGE 72'S  ? ? ?Current Outpatient Medications:  ?  econazole nitrate 1 % cream, Apply topically 2 (two) times daily., Disp: 85 g, Rfl: 2 ?  NEOMYCIN-POLYMYXIN-HYDROCORTISONE (CORTISPORIN) 1 % SOLN OTIC solution, Apply 1-2 drops to toe BID after soaking, Disp: 10 mL, Rfl: 1 ?  ALPRAZolam (XANAX) 1 MG tablet, Take 1 mg by mouth daily as needed., Disp: , Rfl:  ?  amLODipine (NORVASC) 5 MG tablet, Take 1 tablet (5 mg total) by mouth every morning. Overdue for annual appt w/labs must see MD for future refills, Disp: 30 tablet, Rfl: 0 ?  aspirin 81 MG tablet, Take 81 mg by mouth daily., Disp: , Rfl:  ?  atorvastatin (LIPITOR) 20 MG tablet, Take 20 mg by mouth daily., Disp: , Rfl:  ?  clotrimazole-betamethasone (LOTRISONE) cream, APPLY TO AFFECTED AREA TWICE A DAY, Disp: , Rfl:  ?  dutasteride (AVODART) 0.5 MG capsule, Take 0.5 mg by mouth daily., Disp: , Rfl: 3 ?   ibuprofen (ADVIL) 600 MG tablet, Take 1 tablet (600 mg total) by mouth every 6 (six) hours as needed., Disp: 30 tablet, Rfl: 0 ?  sildenafil (VIAGRA) 50 MG tablet, use as directed, take 1-5 tablets BY MOUTH AS NEEDED, Disp: , Rfl: 2 ?  tadalafil (CIALIS) 20 MG tablet, Take by mouth., Disp: , Rfl:  ?  tamsulosin (FLOMAX) 0.4 MG CAPS capsule, Take 1 capsule (0.4 mg total) by mouth daily., Disp: 30 capsule, Rfl: 1 ? ?No Known Allergies ?Review of Systems ?Objective:  ?There were no vitals filed for this visit. ? ?General: Well developed, nourished, in no acute distress, alert and oriented x3  ? ?Dermatological: Skin is warm, dry and supple bilateral. Nails x 10 are well maintained; remaining integument appears unremarkable at this time. There are no open sores, no preulcerative lesions, no rash or signs of infection present.  Mild interdigital tinea pedis.  Sharp incurvated nail margin medial border hallux left.  No purulence no malodor. ? ?Vascular: Dorsalis Pedis artery and Posterior Tibial artery pedal pulses are 2/4 bilateral with immedate capillary fill time. Pedal hair growth present. No varicosities and no lower extremity edema present bilateral.  ? ?Neruologic: Grossly intact via light touch bilateral. Vibratory intact via tuning fork bilateral. Protective threshold with Jobie Quaker  monofilament intact to all pedal sites bilateral. Patellar and Achilles deep tendon reflexes 2+ bilateral. No Babinski or clonus noted bilateral.  ? ?Musculoskeletal: No gross boney pedal deformities bilateral. No pain, crepitus, or limitation noted with foot and ankle range of motion bilateral. Muscular strength 5/5 in all groups tested bilateral. ? ?Gait: Unassisted, Nonantalgic.  ? ? ?Radiographs: ? ?None taken ? ?Assessment & Plan:  ? ?Assessment: InterDigital tinea pedis.  Ingrown toenail tibial border hallux left. ? ?Plan: Dispensed a prescription for econazole.  Performed a chemical matrixectomy tibial border the hallux  left.  Tolerated procedure well after local anesthetic was administered.  He was provided with both oral and written home-going instruction for the care and soaking of the toe.  I will send over prescription for Cortisporin Otic to be applied twice daily after soaking.  Would like to follow-up with him in about 2 weeks to make sure he is doing well. ? ? ? ? ?Lainie Daubert T. [place], DPM ?

## 2021-10-13 NOTE — Addendum Note (Signed)
Addended by: Tyson Dense T on: 10/13/2021 04:50 PM ? ? Modules accepted: Level of Service ? ?

## 2021-10-13 NOTE — Addendum Note (Signed)
Addended by: Clovis Riley E on: 10/13/2021 11:14 AM ? ? Modules accepted: Orders ? ?

## 2021-10-13 NOTE — Progress Notes (Signed)
NO SHOW. This encounter was created in error - please disregard.

## 2021-10-14 ENCOUNTER — Telehealth: Payer: Self-pay | Admitting: Podiatry

## 2021-10-14 MED ORDER — NEOMYCIN-POLYMYXIN-HC 1 % OT SOLN: OTIC | 1 refills | Status: AC

## 2021-10-14 MED ORDER — ECONAZOLE NITRATE 1 % EX CREA: TOPICAL_CREAM | Freq: Two times a day (BID) | CUTANEOUS | 2 refills | Status: DC

## 2021-10-14 NOTE — Telephone Encounter (Signed)
Patient called his medication from yesterday that was to be sent to [facility] still says pending - can you resend it?    Patient also requesting pain medication for a couple days?  Please advise

## 2021-10-14 NOTE — Telephone Encounter (Signed)
Medications resent

## 2021-10-14 NOTE — Telephone Encounter (Signed)
Patient is calling because his medication will not be ready until tomorrow, should he just keep covered until he picks up? ?The pharmacy is wanting to know if the Deanne Coffer is usually ordered  with the Cortisporin? Please advise. ?

## 2021-10-14 NOTE — Addendum Note (Signed)
Addended by: Clovis Riley E on: 10/14/2021 12:13 PM ? ? Modules accepted: Orders ? ?

## 2021-10-22 DIAGNOSIS — R1013 Epigastric pain: Secondary | ICD-10-CM | POA: Insufficient documentation

## 2021-10-22 DIAGNOSIS — K219 Gastro-esophageal reflux disease without esophagitis: Secondary | ICD-10-CM | POA: Insufficient documentation

## 2021-10-22 DIAGNOSIS — R5382 Chronic fatigue, unspecified: Secondary | ICD-10-CM | POA: Insufficient documentation

## 2021-11-03 ENCOUNTER — Encounter: Payer: Self-pay | Admitting: Podiatry

## 2021-11-03 ENCOUNTER — Ambulatory Visit (INDEPENDENT_AMBULATORY_CARE_PROVIDER_SITE_OTHER): Payer: BC Managed Care – PPO | Admitting: Podiatry

## 2021-11-03 DIAGNOSIS — L600 Ingrowing nail: Secondary | ICD-10-CM

## 2021-11-03 DIAGNOSIS — Z9889 Other specified postprocedural states: Secondary | ICD-10-CM

## 2021-11-03 NOTE — Progress Notes (Signed)
He presents today for follow-up of his nail procedure hallux left.  States that he is doing better.  He states that he continues to soak and continues to cover daily. ? ?Objective: Vital signs are stable he is alert and oriented x3 tibial border of the hallux left demonstrates no erythema edema cellulitis drainage odor appears to be healing very nicely. ? ?Assessment: Well-healing surgical toe. ? ?Plan: Recommend that he continue to soak in Epsom salts and warm water for about another week cover during the daytime but leave open at bedtime watch for signs symptoms of infection should there be any he is to notify us immediately. ?

## 2021-11-05 ENCOUNTER — Telehealth: Payer: Self-pay | Admitting: Orthopedic Surgery

## 2021-11-05 NOTE — Telephone Encounter (Signed)
Pt called about his mri results. ? ?Cb [phone]  ?

## 2021-11-05 NOTE — Telephone Encounter (Signed)
Yes pls let me know when they come back

## 2021-11-06 NOTE — Telephone Encounter (Signed)
Reports placed on your desk and available in [place]. ?

## 2021-11-09 ENCOUNTER — Telehealth: Payer: Self-pay

## 2021-11-09 DIAGNOSIS — G542 Cervical root disorders, not elsewhere classified: Secondary | ICD-10-CM | POA: Insufficient documentation

## 2021-11-09 DIAGNOSIS — M5412 Radiculopathy, cervical region: Secondary | ICD-10-CM | POA: Insufficient documentation

## 2021-11-09 NOTE — Telephone Encounter (Signed)
Patient asking about MRI results.  ?

## 2021-11-10 NOTE — Telephone Encounter (Signed)
I called and reviewed elbow ok neck has some stenosis he will call when he wants c sp esi

## 2021-11-10 NOTE — Telephone Encounter (Signed)
Thx I callred

## 2021-11-10 NOTE — Telephone Encounter (Signed)
noted 

## 2021-12-07 ENCOUNTER — Ambulatory Visit (INDEPENDENT_AMBULATORY_CARE_PROVIDER_SITE_OTHER): Payer: BC Managed Care – PPO | Admitting: Orthopedic Surgery

## 2021-12-07 ENCOUNTER — Encounter: Payer: Self-pay | Admitting: Orthopedic Surgery

## 2021-12-07 DIAGNOSIS — M79601 Pain in right arm: Secondary | ICD-10-CM | POA: Diagnosis not present

## 2021-12-07 DIAGNOSIS — M79602 Pain in left arm: Secondary | ICD-10-CM

## 2021-12-07 NOTE — Progress Notes (Signed)
? ?Office Visit Note ?  ?Patient: Alan Johnson           ?Date of Birth: October 19, 1955           ?MRN: [id number] ?Visit Date: 12/07/2021 ?Requested by: Margarito Courser, MD ?[address] ?[place],  [place] [address] ?PCP: Margarito Courser, MD ? ?Subjective: ?Chief Complaint  ?Patient presents with  ? Other  ?  Scan review ?Elbow  ?Cervical spine  ? ? ?HPI: Alan Johnson is a 66 year old patient who is retired who enjoys playing the piano.  Comes in for follow-up of MRI scan right elbow as well as MRI scan of cervical spine.  Does report some radicular symptoms in both arms.  Both MRI scan reports are reviewed from [facility].  The elbow MRI scan shows a little thickening of the ulnar nerve but no subluxation on physical exam last clinic visit.  MRI of the cervical spine is slightly more concerning with moderate sided disc at C4-5 on the left-hand side with some compression as well as larger disc at C5-6 which is more central.  Overall Alan Johnson is living reasonably well with his symptoms.  Takes over-the-counter medication as needed. ?             ?ROS: All systems reviewed are negative as they relate to the chief complaint within the history of present illness.  Patient denies  fevers or chills. ? ? ?Assessment & Plan: ?Visit Diagnoses:  ?1. Bilateral arm pain   ? ? ?Plan: Impression is bilateral radiculopathy with C5-6 central disc osteophyte complex.  He is also having some mild knee pain.  Exam of the knees today and he does not have any effusion or loss of motion.  I think injections for the knees would be a starting point and injection in the neck would also be a starting point but he could potentially be heading for surgery in the neck.  He has multiple level involvement so this could be somewhat involved.  If his symptoms worsen including intractable type pain or weakness in the arms then he should come back for cervical spine evaluation and likely injection.  Could manifest in terms of the timing with his piano playing.  All  questions answered.  We will see him back as needed ? ?Follow-Up Instructions: No follow-ups on file.  ? ?Orders:  ?No orders of the defined types were placed in this encounter. ? ?No orders of the defined types were placed in this encounter. ? ? ? ? Procedures: ?No procedures performed ? ? ?Clinical Data: ?No additional findings. ? ?Objective: ?Vital Signs: There were no vitals taken for this visit. ? ?Physical Exam:  ? ?Constitutional: Patient appears well-developed ?HEENT:  ?Head: Normocephalic ?Eyes:EOM are normal ?Neck: Normal range of motion ?Cardiovascular: Normal rate ?Pulmonary/chest: Effort normal ?Neurologic: Patient is alert ?Skin: Skin is warm ?Psychiatric: Patient has normal mood and affect ? ? ?Ortho Exam: Bilateral knee exam demonstrates excellent range of motion with good quad and hamstring strength negative Babinski negative clonus and no effusion in either knee.  Bilateral upper extremities show 5 out of 5 grip EPL FPL interosseous wrist flexion extension bicep triceps and deltoid strength.  Pedal pulses palpable.  Radial pulses intact.  No definite paresthesias C5-T1.  No subluxation or tenderness of the ulnar nerve on either side.  Neck range of motion also reasonable with flexion almost chin to chest extension about 40 degrees and rotation 45 degrees bilaterally. ? ?Specialty Comments:  ?No specialty comments available. ? ?Imaging: ?No results  found. ? ? ?PMFS History: ?Patient Active Problem List  ? Diagnosis Date Noted  ? BPH (benign prostatic hyperplasia) 10/13/2021  ? Trigger finger of all digits of both hands 11/11/2020  ? Referred otalgia of right ear 07/10/2020  ? Need for vaccination 10/18/2019  ? Dermatitis 07/24/2019  ? Chronic pain of both knees 07/03/2019  ? Acute bacterial sinusitis 05/22/2019  ? Need for immunization against influenza 05/22/2019  ? Controlled substance agreement signed 01/30/2019  ? Cyst near coccyx 01/30/2019  ? Annual physical exam 01/16/2019  ? Generalized  anxiety disorder 08/23/2018  ? Right elbow tendinitis 08/23/2018  ? Sprain of right shoulder 08/23/2018  ? Chronic prescription benzodiazepine use 06/14/2018  ? Mixed hyperlipidemia 06/14/2018  ? Epistaxis 09/13/2017  ? Recurrent URI (upper respiratory infection) 09/13/2017  ? Erectile dysfunction after radical prostatectomy 12/30/2016  ? Insomnia 12/09/2015  ? Pain, dental 12/09/2015  ? Anxiety 06/03/2015  ? BP (high blood pressure) 06/03/2015  ? Stage T1c adenocarcinoma of the prostate with a Gleason's score of 3+3 and a PSA of 9.6 11/09/2013  ? ?Past Medical History:  ?Diagnosis Date  ? Adenocarcinoma of prostate ([place])   ? stage T1c  ? Anxiety   ? Heart murmur   ? ASYMPTOMATIC  ? History of prostatitis   ? Hypertension   ?  ?Family History  ?Problem Relation Age of Onset  ? Stroke Father   ? Healthy Mother   ? Healthy Maternal Grandmother   ? Throat cancer Maternal Grandfather   ? Pancreatic cancer Paternal Grandmother   ?  ?Past Surgical History:  ?Procedure Laterality Date  ? INGUINAL HERNIA REPAIR Left 1980'S  ? PROSTATE BIOPSY    ? RADIOACTIVE SEED IMPLANT N/A 12/27/2013  ? Procedure: RADIOACTIVE SEED IMPLANT;  Surgeon: [name], MD;  Location: [facility];  Service: Urology;  Laterality: N/A;  DR portable  ? TONSILLECTOMY  AGE 60'S  ? ?Social History  ? ?Occupational History  ? Occupation: Patient transporter  ?Tobacco Use  ? Smoking status: Never  ? Smokeless tobacco: Never  ?Substance and Sexual Activity  ? Alcohol use: Yes  ?  Comment: OCCASIONAL  ? Drug use: No  ? Sexual activity: Not on file  ? ? ? ? ? ?

## 2021-12-15 DIAGNOSIS — L2089 Other atopic dermatitis: Secondary | ICD-10-CM | POA: Insufficient documentation

## 2021-12-15 DIAGNOSIS — R052 Subacute cough: Secondary | ICD-10-CM | POA: Insufficient documentation

## 2021-12-23 ENCOUNTER — Ambulatory Visit: Payer: Self-pay

## 2021-12-23 ENCOUNTER — Ambulatory Visit (INDEPENDENT_AMBULATORY_CARE_PROVIDER_SITE_OTHER): Payer: BC Managed Care – PPO | Admitting: Surgical

## 2021-12-23 ENCOUNTER — Ambulatory Visit (INDEPENDENT_AMBULATORY_CARE_PROVIDER_SITE_OTHER): Payer: BC Managed Care – PPO

## 2021-12-23 VITALS — Ht 67.0 in | Wt 220.0 lb

## 2021-12-23 DIAGNOSIS — M79602 Pain in left arm: Secondary | ICD-10-CM | POA: Diagnosis not present

## 2021-12-23 DIAGNOSIS — G8929 Other chronic pain: Secondary | ICD-10-CM

## 2021-12-23 DIAGNOSIS — M25562 Pain in left knee: Secondary | ICD-10-CM

## 2021-12-23 DIAGNOSIS — M79601 Pain in right arm: Secondary | ICD-10-CM | POA: Diagnosis not present

## 2021-12-23 DIAGNOSIS — M25561 Pain in right knee: Secondary | ICD-10-CM

## 2021-12-24 ENCOUNTER — Encounter: Payer: Self-pay | Admitting: Podiatry

## 2021-12-24 ENCOUNTER — Encounter: Payer: Self-pay | Admitting: Orthopedic Surgery

## 2021-12-24 ENCOUNTER — Ambulatory Visit (INDEPENDENT_AMBULATORY_CARE_PROVIDER_SITE_OTHER): Payer: BC Managed Care – PPO | Admitting: Podiatry

## 2021-12-24 DIAGNOSIS — L603 Nail dystrophy: Secondary | ICD-10-CM | POA: Diagnosis not present

## 2021-12-24 NOTE — Progress Notes (Signed)
He presents today with concerns about a discoloration of for his matrixectomy was performed along the medial border of the hallux left.  He says it does not hurt but he is concerned about the discoloration.  Objective: Vital signs are stable alert and oriented x3.  Pulses are palpable.  The discoloration of the nail appears to be where the nail was cut and removed from the margin.  Does not appear to be fungus but we will watch it and make sure that it grows out.  Assessment: Well-healing matrixectomy.  Plan: Follow-up with [place] as needed

## 2021-12-24 NOTE — Progress Notes (Signed)
Office Visit Note   Patient: Alan Johnson           Date of Birth: 1955/10/08           MRN: [id number] Visit Date: 12/23/2021 Requested by: Margarito Courser, MD [address] [place],  [place] [address] PCP: Margarito Courser, MD  Subjective: Chief Complaint  Patient presents with   Neck - Follow-up   Right Elbow - Follow-up   Left Knee - Pain   Right Knee - Pain    HPI: Alan Johnson is a 65 y.o. male who presents to the office complaining of bilateral knee pain.  He continues to complain of bilateral knee pain with both knees bothering him equally.  Most of his pain is at night.  Localizes pain diffusely throughout the knee joints.  Feels like a pressure in his knee joints.  No mechanical symptoms or instability.  Pain does wake him up at night.  Denies any groin pain or radicular pain.  No history of diabetes, smoking, blood thinner use.  He is also here today to review the imaging from his cervical spine MRI..                ROS: All systems reviewed are negative as they relate to the chief complaint within the history of present illness.  Patient denies fevers or chills.  Assessment & Plan: Visit Diagnoses:  1. Chronic pain of both knees   2. Bilateral arm pain     Plan: Patient is a 66 year old male who presents for evaluation of bilateral knee pain.  He is here today to also review MRI cervical spine.  MRI of the cervical spine demonstrated broad-based disc osteophyte complex at C4-C5 contacting the ventral spinal cord with moderate central canal and left foraminal stenosis at C5-C6 and moderate right foraminal stenosis at multilevels.  Also has severe bilateral foraminal stenosis at C2-C3.  After discussion of options, plan to refer patient to Dr. Laurence Spates for cervical spine ESI.  Also, reviewed radiographs of both knees today.  He has minimal degenerative changes of the medial compartment of both knees.  This correlates with his medial joint line tenderness.  However, his pain seems  out of proportion to radiographic findings.  Discussed cortisone injections versus further evaluation with MRI scan.  He would like to proceed with MRI.  Ordered bilateral knee MRI.  Follow-up after MRI to review results.  Follow-Up Instructions: No follow-ups on file.   Orders:  Orders Placed This Encounter  Procedures   XR Knee 1-2 Views Right   XR KNEE 3 VIEW LEFT   MR Knee Right w/o contrast   MR Knee Left w/o contrast   Ambulatory referral to Physical Medicine Rehab   No orders of the defined types were placed in this encounter.     Procedures: No procedures performed   Clinical Data: No additional findings.  Objective: Vital Signs: Ht '5\' 7"'$  (1.702 m)   Wt 220 lb (99.8 kg)   BMI 34.46 kg/m   Physical Exam:  Constitutional: Patient appears well-developed HEENT:  Head: Normocephalic Eyes:EOM are normal Neck: Normal range of motion Cardiovascular: Normal rate Pulmonary/chest: Effort normal Neurologic: Patient is alert Skin: Skin is warm Psychiatric: Patient has normal mood and affect  Ortho Exam: Ortho exam demonstrates both knees with 0 degrees extension and about 115 degrees of knee flexion.  No effusion noted in either knee.  Medial joint line tenderness present in both knees.  No lateral joint line tenderness.  Able  to perform straight leg raise with both knees.  No calf tenderness.  Negative Homans' sign.  No pain with hip range of motion.  No cellulitis or skin changes noted.  Specialty Comments:  No specialty comments available.  Imaging: No results found.   PMFS History: Patient Active Problem List   Diagnosis Date Noted   Other atopic dermatitis 12/15/2021   Subacute cough 12/15/2021   Cervical radicular pain 11/09/2021   Cervical spine syndrome 11/09/2021   Chronic fatigue 10/22/2021   Epigastric abdominal pain 10/22/2021   Gastroesophageal reflux disease without esophagitis 10/22/2021   BPH (benign prostatic hyperplasia) 10/13/2021   Trigger  finger of all digits of both hands 11/11/2020   Referred otalgia of right ear 07/10/2020   Need for vaccination 10/18/2019   Dermatitis 07/24/2019   Chronic pain of both knees 07/03/2019   Acute bacterial sinusitis 05/22/2019   Need for immunization against influenza 05/22/2019   Controlled substance agreement signed 01/30/2019   Cyst near coccyx 01/30/2019   Annual physical exam 01/16/2019   Generalized anxiety disorder 08/23/2018   Right elbow tendinitis 08/23/2018   Sprain of right shoulder 08/23/2018   Chronic prescription benzodiazepine use 06/14/2018   Mixed hyperlipidemia 06/14/2018   Epistaxis 09/13/2017   Recurrent URI (upper respiratory infection) 09/13/2017   Erectile dysfunction after radical prostatectomy 12/30/2016   Insomnia 12/09/2015   Pain, dental 12/09/2015   Anxiety 06/03/2015   BP (high blood pressure) 06/03/2015   Stage T1c adenocarcinoma of the prostate with a Gleason's score of 3+3 and a PSA of 9.6 11/09/2013   Past Medical History:  Diagnosis Date   Adenocarcinoma of prostate ([place])    stage T1c   Anxiety    Heart murmur    ASYMPTOMATIC   History of prostatitis    Hypertension     Family History  Problem Relation Age of Onset   Stroke Father    Healthy Mother    Healthy Maternal Grandmother    Throat cancer Maternal Grandfather    Pancreatic cancer Paternal Grandmother     Past Surgical History:  Procedure Laterality Date   INGUINAL HERNIA REPAIR Left 1980'S   PROSTATE BIOPSY     RADIOACTIVE SEED IMPLANT N/A 12/27/2013   Procedure: RADIOACTIVE SEED IMPLANT;  Surgeon: [name], MD;  Location: [facility];  Service: Urology;  Laterality: N/A;  DR portable   TONSILLECTOMY  AGE 61'S   Social History   Occupational History   Occupation: Patient transporter  Tobacco Use   Smoking status: Never   Smokeless tobacco: Never  Substance and Sexual Activity   Alcohol use: Yes    Comment: OCCASIONAL   Drug use: No   Sexual  activity: Not on file

## 2022-01-20 ENCOUNTER — Telehealth: Payer: Self-pay | Admitting: Orthopedic Surgery

## 2022-01-20 ENCOUNTER — Telehealth: Payer: Self-pay | Admitting: Physical Medicine and Rehabilitation

## 2022-01-20 DIAGNOSIS — M4722 Other spondylosis with radiculopathy, cervical region: Secondary | ICD-10-CM | POA: Insufficient documentation

## 2022-01-20 NOTE — Telephone Encounter (Signed)
Patient would like something for pain, Ibuprofen not working    [facility]

## 2022-01-20 NOTE — Telephone Encounter (Signed)
Patient request a call back to schedule from the referral.

## 2022-01-21 ENCOUNTER — Other Ambulatory Visit: Payer: Self-pay | Admitting: Surgical

## 2022-01-21 MED ORDER — CELECOXIB 100 MG PO CAPS: 100.0000 mg | ORAL_CAPSULE | Freq: Two times a day (BID) | ORAL | 0 refills | Status: AC

## 2022-01-21 NOTE — Telephone Encounter (Signed)
Sent in RX for Celebrex.  Dont take with Ibuprofen. If no improvement, could try temporary RX of Tramadol until he gets c-spine injection

## 2022-01-21 NOTE — Telephone Encounter (Signed)
IC discussed per Runell Gess note. Patient verbalized understanding.

## 2022-01-25 ENCOUNTER — Ambulatory Visit
Admission: RE | Admit: 2022-01-25 | Discharge: 2022-01-25 | Disposition: A | Discharge status: Home / Self Care | Payer: BC Managed Care – PPO | Source: Ambulatory Visit | Attending: Surgical | Admitting: Surgical

## 2022-01-25 ENCOUNTER — Telehealth: Payer: Self-pay | Admitting: Physical Medicine and Rehabilitation

## 2022-01-25 DIAGNOSIS — G8929 Other chronic pain: Secondary | ICD-10-CM

## 2022-01-25 NOTE — Telephone Encounter (Signed)
Patient called asked if he can come in and see Dr. Ernestina Patches today? Patient said he has a funeral to go to tomorrow. The number to contact patient is [phone]

## 2022-01-26 ENCOUNTER — Ambulatory Visit: Payer: BC Managed Care – PPO | Admitting: Physical Medicine and Rehabilitation

## 2022-02-02 ENCOUNTER — Encounter: Payer: Self-pay | Admitting: Physical Medicine and Rehabilitation

## 2022-02-02 ENCOUNTER — Ambulatory Visit (INDEPENDENT_AMBULATORY_CARE_PROVIDER_SITE_OTHER): Payer: BC Managed Care – PPO | Admitting: Physical Medicine and Rehabilitation

## 2022-02-02 VITALS — BP 149/92 | HR 68

## 2022-02-02 DIAGNOSIS — M5412 Radiculopathy, cervical region: Secondary | ICD-10-CM

## 2022-02-02 DIAGNOSIS — M542 Cervicalgia: Secondary | ICD-10-CM | POA: Diagnosis not present

## 2022-02-02 DIAGNOSIS — M5010 Cervical disc disorder with radiculopathy, unspecified cervical region: Secondary | ICD-10-CM | POA: Diagnosis not present

## 2022-02-02 DIAGNOSIS — M4802 Spinal stenosis, cervical region: Secondary | ICD-10-CM | POA: Diagnosis not present

## 2022-02-02 NOTE — Progress Notes (Signed)
Pt state neck pain that travels up the back of his head. Pt state carrying and lifting makes the pain worse. Pt state he uses ice to help ease his pain.  Numeric Pain Rating Scale and Functional Assessment Average Pain 10 Pain Right Now 8 My pain is constant, dull, stabbing, and aching Pain is worse with: some activites and lifting, carying Pain improves with: rest and heat/ice   In the last MONTH (on 0-10 scale) has pain interfered with the following?  1. General activity like being  able to carry out your everyday physical activities such as walking, climbing stairs, carrying groceries, or moving a chair?  Rating(5)  2. Relation with others like being able to carry out your usual social activities and roles such as  activities at home, at work and in your community. Rating(6)  3. Enjoyment of life such that you have  been bothered by emotional problems such as feeling anxious, depressed or irritable?  Rating(7)

## 2022-02-02 NOTE — Progress Notes (Signed)
Alan Johnson - 66 y.o. male MRN [id number]  Date of birth: [birth year]  Office Visit Note: Visit Date: 02/02/2022 PCP: Margarito Courser, MD Referred by: Margarito Courser, MD  Subjective: Chief Complaint  Patient presents with   Neck - Pain   Head - Pain   HPI: Alan Johnson is a 66 y.o. male who comes in today at the request of Gloriann Loan, PA for chronic, worsening and severe left sided neck pain radiating to left shoulder, also reports pain/numbness to bilateral arms. Patient reports left sided neck pain has been constant for 1 year, radicular symptoms to bilateral arms ongoing for 2 months. Patient states pain is exacerbated by movement/activity, describes his pain as a sore, aching and tingling sensation, currently rates as 7 out of 10.  He reports some relief of pain with home exercise regimen, use of heating pad, ice and medications.  Patient states he was recently prescribed Celebrex but has not starting taking. Patient's recent cervical MRI exhibits broad-based left eccentric disc osteophyte complex at C4-C5 contacting the ventral spinal cord and exiting left C5 nerve root, moderate spinal canal stenosis noted at C5-C6, and small left foraminal focal disc protrusion at C7-T1 potentially impinging on the exiting left C8 nerve root.  Patient has no history of cervical surgery/cervical injections.  Patient states he is concerned about injection as this is not a permanent fix to his issues, patient would like to discuss cervical injection procedure and also other potential treatments such as surgical intervention. Patient denies focal weakness. Patient denies recent trauma or falls.     Review of Systems  Musculoskeletal:  Positive for neck pain.  Neurological:  Positive for tingling. Negative for sensory change, focal weakness and weakness.  All other systems reviewed and are negative.  Otherwise per HPI.  Assessment & Plan: Visit Diagnoses:    ICD-10-CM   1. Radiculopathy, cervical region   M54.12     2. Cervicalgia  M54.2     3. Cervical disc disorder with radiculopathy, unspecified cervical region  M50.10     4. Spinal stenosis of cervical region  M48.02        Plan: Findings:  Chronic, worsening and severe left sided neck pain radiating to left shoulder and down to bilateral arms. Patient continues to have severe pain despite good conservative therapies such as home exercise regimen, rest, heat/ice and use of medications. I did discuss patients cervical MRI imaging with him using spine model. Patient's clinical presentation and exam are consistent with cervical radiculopathy, there is moderate spinal canal stenosis noted on recent cervical MRI imaging. We believe the next step is to perform a diagnostic and hopefully therapeutic left C7-T1 interlaminar epidural steroid injection under fluoroscopic guidance. He is not currently taking anticoagulant medications. I did discuss cervical injection procedure with patient in detail today, he does voice concerns about anxiety related to this procedure. I did inform patient that I am happy to write a prescription for pre-procedure sedation with Valium. Patient states he would like some time to think about injection and discuss procedure with his wife. Patient instructed to call us if he would like to proceed with injection so that we can get him scheduled. No red flag symptoms noted upon exam today.     Meds & Orders: No orders of the defined types were placed in this encounter.  No orders of the defined types were placed in this encounter.   Follow-up: Return for Left C7-T1 interlaminar epidural steroid injection.   Procedures: No  procedures performed      Clinical History: IMPRESSION:  Broad-based left eccentric disc osteophyte complex at C4-5 contacting the ventral spinal cord and exiting left C5 nerve root   Small left foraminal focal disc protrusion potentially impinging on the exiting left C8 nerve root.   Moderate central  canal and left foraminal stenosis at C5-6   Potential multilevel mild to moderate right foraminal stenosis most pronounced at C7-T1 an from C3 through C5.   Mild central canal and moderate to severe bilateral foraminal stenosis at C2-3.   Electronically Signed by: Carrington Clamp on 11/04/2021 3:57 PM   He reports that he has never smoked. He has never used smokeless tobacco. No results for input(s): "HGBA1C", "LABURIC" in the last 8760 hours.  Objective:  VS:  HT:    WT:   BMI:     BP:(!) 149/92  HR:68bpm  TEMP: ( )  RESP:  Physical Exam Vitals and nursing note reviewed.  HENT:     Head: Normocephalic and atraumatic.     Right Ear: External ear normal.     Left Ear: External ear normal.     Nose: Nose normal.     Mouth/Throat:     Mouth: Mucous membranes are moist.  Eyes:     Extraocular Movements: Extraocular movements intact.  Cardiovascular:     Rate and Rhythm: Normal rate.     Pulses: Normal pulses.  Pulmonary:     Effort: Pulmonary effort is normal.  Abdominal:     General: Abdomen is flat. There is no distension.  Musculoskeletal:        General: Tenderness present.     Cervical back: Tenderness present.     Comments: Discomfort noted with flexion, extension and side-to-side rotation. Patient has good strength in the upper extremities including 5 out of 5 strength in wrist extension, long finger flexion and APB.  There is no atrophy of the hands intrinsically.  Sensation intact bilaterally. Negative Hoffman's sign.   Skin:    General: Skin is warm and dry.     Capillary Refill: Capillary refill takes less than 2 seconds.  Neurological:     General: No focal deficit present.     Mental Status: He is alert and oriented to person, place, and time.  Psychiatric:        Mood and Affect: Mood normal.        Behavior: Behavior normal.     Ortho Exam  Imaging: No results found.  Past Medical/Family/Surgical/Social History: Medications & Allergies reviewed per  EMR, new medications updated. Patient Active Problem List   Diagnosis Date Noted   Other atopic dermatitis 12/15/2021   Subacute cough 12/15/2021   Cervical radicular pain 11/09/2021   Cervical spine syndrome 11/09/2021   Chronic fatigue 10/22/2021   Epigastric abdominal pain 10/22/2021   Gastroesophageal reflux disease without esophagitis 10/22/2021   BPH (benign prostatic hyperplasia) 10/13/2021   Trigger finger of all digits of both hands 11/11/2020   Referred otalgia of right ear 07/10/2020   Need for vaccination 10/18/2019   Dermatitis 07/24/2019   Chronic pain of both knees 07/03/2019   Acute bacterial sinusitis 05/22/2019   Need for immunization against influenza 05/22/2019   Controlled substance agreement signed 01/30/2019   Cyst near coccyx 01/30/2019   Annual physical exam 01/16/2019   Generalized anxiety disorder 08/23/2018   Right elbow tendinitis 08/23/2018   Sprain of right shoulder 08/23/2018   Chronic prescription benzodiazepine use 06/14/2018   Mixed hyperlipidemia 06/14/2018  Epistaxis 09/13/2017   Recurrent URI (upper respiratory infection) 09/13/2017   Erectile dysfunction after radical prostatectomy 12/30/2016   Insomnia 12/09/2015   Pain, dental 12/09/2015   Anxiety 06/03/2015   BP (high blood pressure) 06/03/2015   Stage T1c adenocarcinoma of the prostate with a Gleason's score of 3+3 and a PSA of 9.6 11/09/2013   Past Medical History:  Diagnosis Date   Adenocarcinoma of prostate ([place])    stage T1c   Anxiety    Heart murmur    ASYMPTOMATIC   History of prostatitis    Hypertension    Family History  Problem Relation Age of Onset   Stroke Father    Healthy Mother    Healthy Maternal Grandmother    Throat cancer Maternal Grandfather    Pancreatic cancer Paternal Grandmother    Past Surgical History:  Procedure Laterality Date   INGUINAL HERNIA REPAIR Left 1980'S   PROSTATE BIOPSY     RADIOACTIVE SEED IMPLANT N/A 12/27/2013   Procedure:  RADIOACTIVE SEED IMPLANT;  Surgeon: [name], MD;  Location: [facility];  Service: Urology;  Laterality: N/A;  DR portable   TONSILLECTOMY  AGE 64'S   Social History   Occupational History   Occupation: Patient transporter  Tobacco Use   Smoking status: Never   Smokeless tobacco: Never  Substance and Sexual Activity   Alcohol use: Yes    Comment: OCCASIONAL   Drug use: No   Sexual activity: Not on file

## 2022-02-03 NOTE — Progress Notes (Signed)
Needs f/u appt 

## 2022-02-08 ENCOUNTER — Ambulatory Visit (INDEPENDENT_AMBULATORY_CARE_PROVIDER_SITE_OTHER): Payer: BC Managed Care – PPO | Admitting: Orthopedic Surgery

## 2022-02-08 DIAGNOSIS — M25561 Pain in right knee: Secondary | ICD-10-CM

## 2022-02-08 DIAGNOSIS — G8929 Other chronic pain: Secondary | ICD-10-CM

## 2022-02-08 DIAGNOSIS — M25562 Pain in left knee: Secondary | ICD-10-CM | POA: Diagnosis not present

## 2022-02-13 ENCOUNTER — Encounter: Payer: Self-pay | Admitting: Orthopedic Surgery

## 2022-02-13 NOTE — Progress Notes (Signed)
Office Visit Note   Patient: Alan Johnson           Date of Birth: 14-Jul-1956           MRN: [id number] Visit Date: 02/08/2022 Requested by: Margarito Courser, MD [id number] [place],  [place] [address] PCP: Margarito Courser, MD  Subjective: Chief Complaint  Patient presents with   Other     Scan review    HPI: Koal is a 66 year old patient with bilateral knee pain.  Here to review MRI scans on both the right and left knee.  Right knee MRI scan is reviewed with the patient.  Shows intrasubstance degeneration of the medial meniscus without definitive meniscal tear.  Mild medial compartment arthritis.  MRI scan of the left knee is also reviewed.  This shows focal surface under fraying of the posterior horn medial meniscus with no definitive meniscal tear.  Some partial-thickness cartilage loss present in the medial compartment.              ROS: All systems reviewed are negative as they relate to the chief complaint within the history of present illness.  Patient denies  fevers or chills.   Assessment & Plan: Visit Diagnoses:  1. Chronic pain of both knees     Plan: Impression is bilateral knee pain with no definitive operative pathology present.  Overall the scans really match up to the severity of pain he is having.  Lumbar spine MRI could be considered but he wants to hold off on that for now.  Does have some cervical spine stenosis for which he may benefit from cervical spine ESI.  Same thing could be going on in the lower back to account for some of his knee pain which does not look too terrible on MRI scanning.  He will consider his options and let us know if he wants to proceed with injections in the knees or scanning on the back.  Follow-up as needed.  Follow-Up Instructions: No follow-ups on file.   Orders:  No orders of the defined types were placed in this encounter.  No orders of the defined types were placed in this encounter.     Procedures: No procedures  performed   Clinical Data: No additional findings.  Objective: Vital Signs: There were no vitals taken for this visit.  Physical Exam:   Constitutional: Patient appears well-developed HEENT:  Head: Normocephalic Eyes:EOM are normal Neck: Normal range of motion Cardiovascular: Normal rate Pulmonary/chest: Effort normal Neurologic: Patient is alert Skin: Skin is warm Psychiatric: Patient has normal mood and affect   Ortho Exam: Ortho exam demonstrates full active and passive range of motion of hips and knees.  No real effusion in either knee.  Has some mild pain anterior to palpation along the tibial tubercle and patellar tendon.  Collateral crucial ligaments are stable.  No nerve root tension signs and no paresthesias in the legs.  Specialty Comments:  IMPRESSION:  Broad-based left eccentric disc osteophyte complex at C4-5 contacting the ventral spinal cord and exiting left C5 nerve root   Small left foraminal focal disc protrusion potentially impinging on the exiting left C8 nerve root.   Moderate central canal and left foraminal stenosis at C5-6   Potential multilevel mild to moderate right foraminal stenosis most pronounced at C7-T1 an from C3 through C5.   Mild central canal and moderate to severe bilateral foraminal stenosis at C2-3.   Electronically Signed by: Carrington Clamp on 11/04/2021 3:57 PM  Imaging: No results found.  PMFS History: Patient Active Problem List   Diagnosis Date Noted   Other atopic dermatitis 12/15/2021   Subacute cough 12/15/2021   Cervical radicular pain 11/09/2021   Cervical spine syndrome 11/09/2021   Chronic fatigue 10/22/2021   Epigastric abdominal pain 10/22/2021   Gastroesophageal reflux disease without esophagitis 10/22/2021   BPH (benign prostatic hyperplasia) 10/13/2021   Trigger finger of all digits of both hands 11/11/2020   Referred otalgia of right ear 07/10/2020   Need for vaccination 10/18/2019   Dermatitis  07/24/2019   Chronic pain of both knees 07/03/2019   Acute bacterial sinusitis 05/22/2019   Need for immunization against influenza 05/22/2019   Controlled substance agreement signed 01/30/2019   Cyst near coccyx 01/30/2019   Annual physical exam 01/16/2019   Generalized anxiety disorder 08/23/2018   Right elbow tendinitis 08/23/2018   Sprain of right shoulder 08/23/2018   Chronic prescription benzodiazepine use 06/14/2018   Mixed hyperlipidemia 06/14/2018   Epistaxis 09/13/2017   Recurrent URI (upper respiratory infection) 09/13/2017   Erectile dysfunction after radical prostatectomy 12/30/2016   Insomnia 12/09/2015   Pain, dental 12/09/2015   Anxiety 06/03/2015   BP (high blood pressure) 06/03/2015   Stage T1c adenocarcinoma of the prostate with a Gleason's score of 3+3 and a PSA of 9.6 11/09/2013   Past Medical History:  Diagnosis Date   Adenocarcinoma of prostate ([place])    stage T1c   Anxiety    Heart murmur    ASYMPTOMATIC   History of prostatitis    Hypertension     Family History  Problem Relation Age of Onset   Stroke Father    Healthy Mother    Healthy Maternal Grandmother    Throat cancer Maternal Grandfather    Pancreatic cancer Paternal Grandmother     Past Surgical History:  Procedure Laterality Date   INGUINAL HERNIA REPAIR Left 1980'S   PROSTATE BIOPSY     RADIOACTIVE SEED IMPLANT N/A 12/27/2013   Procedure: RADIOACTIVE SEED IMPLANT;  Surgeon: [name], MD;  Location: [facility];  Service: Urology;  Laterality: N/A;  DR portable   TONSILLECTOMY  AGE 29'S   Social History   Occupational History   Occupation: Patient transporter  Tobacco Use   Smoking status: Never   Smokeless tobacco: Never  Substance and Sexual Activity   Alcohol use: Yes    Comment: OCCASIONAL   Drug use: No   Sexual activity: Not on file

## 2022-03-18 ENCOUNTER — Encounter: Payer: Self-pay | Admitting: Podiatry

## 2022-03-18 ENCOUNTER — Ambulatory Visit (INDEPENDENT_AMBULATORY_CARE_PROVIDER_SITE_OTHER): Payer: BC Managed Care – PPO | Admitting: Podiatry

## 2022-03-18 DIAGNOSIS — L603 Nail dystrophy: Secondary | ICD-10-CM | POA: Diagnosis not present

## 2022-03-18 NOTE — Progress Notes (Signed)
He presents today concerned about his hallux left for the medial border broke off after the hole grew out.  He states that is still little tender and dark he is concerned about to be concerned about the piece of the nail that is broken off.  He is wondering if we will have to do a procedure to the toe.  Objective: Vital signs are stable he is alert and oriented x 3.  Pulses are palpable.  Hallux nail bilaterally is discolored and thickened with some subungual debris particularly the hallux right.  Assessment: Nail dystrophy hallux bilaterally.  Plan: At this point were going to take sample of the nail sent for pathologic evaluation.  Should this come back abnormal we will notify him and get him started on medications.

## 2022-10-26 ENCOUNTER — Other Ambulatory Visit: Payer: Self-pay | Admitting: Podiatry

## 2022-11-03 ENCOUNTER — Telehealth: Payer: Self-pay | Admitting: Orthopedic Surgery

## 2022-11-03 NOTE — Telephone Encounter (Signed)
Patient called asked if he can be worked into Dr. Marlou Sa or Runell Gess schedule sooner due to the  pain he is experiencing. Patient asked if he can be told what type of cyst he has in his neck.  [phone]

## 2022-11-09 ENCOUNTER — Ambulatory Visit (INDEPENDENT_AMBULATORY_CARE_PROVIDER_SITE_OTHER): Payer: BC Managed Care – PPO | Admitting: Orthopedic Surgery

## 2022-11-09 ENCOUNTER — Encounter: Payer: Self-pay | Admitting: Orthopedic Surgery

## 2022-11-09 DIAGNOSIS — M25561 Pain in right knee: Secondary | ICD-10-CM

## 2022-11-09 DIAGNOSIS — G8929 Other chronic pain: Secondary | ICD-10-CM | POA: Diagnosis not present

## 2022-11-09 DIAGNOSIS — M25562 Pain in left knee: Secondary | ICD-10-CM | POA: Diagnosis not present

## 2022-11-09 NOTE — Progress Notes (Addendum)
Office Visit Note   Patient: Alan Johnson           Date of Birth: [birth year]           MRN: [id number] Visit Date: 11/09/2022 Requested by: Margarito Courser, MD [address] [place],  [place] [address] PCP: Margarito Courser, MD  Subjective: Chief Complaint  Patient presents with   Neck - Follow-up    HPI: Alan Johnson is a 67 y.o. male who presents to the office reporting bilateral knee pain as well as occasional neck pain.  MRI scan of the cervical spine from last year demonstrated broad-based left eccentric disc osteophyte complex at C4-5 irritating potentially the left C5 nerve root along with moderate central canal and left foraminal stenosis at C5-6.  Severe bilateral foraminal stenosis is present at C2-3.  Has not had cervical spine ESI's.  Generally want to have the neck treated nonoperatively.  Patient also reports on activity related bilateral lower extremity and knee pain.  Would like to be referred for water therapy.  Pain does wake him from sleep at night.  Generally it is worse when he is at rest and not when he is ambulating.  Denies any numbness and tingling.  Has had bilateral knee MRI scans which show mild arthritis in the medial compartment but no definite operative pathology.  Has never had injections in the knee.  Takes over-the-counter medication at times.  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                ROS: All systems reviewed are negative as they relate to the chief complaint within the history of present illness.  Patient denies fevers or chills.  Assessment & Plan: Visit Diagnoses:  1. Chronic pain of both knees     Plan: Impression is neck degenerative disc disease with moderate canal stenosis a year ago and some foraminal stenosis.  He has excellent strength and no paresthesias today as well as good right neck range of motion.  No intervention really required for the neck unless he becomes symptomatic at which time I would recommend cervical  spine ESI's.  Regarding bilateral lower extremities this clinical scenario is a little less clear.  He wants to try some therapy which I think is reasonable.  He has had an MRI scan done by his report but we do not have the images available.  MRI scan of the lumbar spine from 8 years ago was fairly unremarkable except for some mild degenerative disc disease at L5-S1.  I think looking at the more recent MRI scan would be helpful.  We did try to localize it and find the report and the study using all means available through epic and other avenues but could not find either the report or the imaging.  Moderate therapy at [facility] also prescribed for lower extremity strengthening and functional rehabilitation 3 times a week for 4 weeks  Follow-Up Instructions: No follow-ups on file.   Orders:  Orders Placed This Encounter  Procedures   Ambulatory referral to Physical Therapy   No orders of the defined types were placed in this encounter.     Procedures: No procedures performed   Clinical Data: No additional findings.  Objective: Vital Signs: There were no vitals taken for this visit.  Physical Exam:  Constitutional: Patient appears well-developed HEENT:  Head: Normocephalic Eyes:EOM are normal Neck: Normal range of motion Cardiovascular: Normal rate Pulmonary/chest: Effort normal Neurologic: Patient is alert Skin: Skin is warm  Psychiatric: Patient has normal mood and affect  Ortho Exam: Ortho exam demonstrates 5 out of 5 grip EPL FPL interosseous wrist flexion extension bicep triceps and deltoid strength.  Has very good radial pulses bilaterally.  Full active and passive range of motion of the shoulders elbows and wrist.  Cervical spine range of motion has good flexion extension rotation without pain or tenderness.  No paresthesias C5-T1.  Bilateral lower extremity exam demonstrates excellent quad and hamstring strength and no atrophy.  No effusion in either knee.  Pedal pulses  palpable.  No masses lymphadenopathy or skin changes noted in the knee region.  Specialty Comments:  IMPRESSION:  Broad-based left eccentric disc osteophyte complex at C4-5 contacting the ventral spinal cord and exiting left C5 nerve root   Small left foraminal focal disc protrusion potentially impinging on the exiting left C8 nerve root.   Moderate central canal and left foraminal stenosis at C5-6   Potential multilevel mild to moderate right foraminal stenosis most pronounced at C7-T1 an from C3 through C5.   Mild central canal and moderate to severe bilateral foraminal stenosis at C2-3.   Electronically Signed by: Carrington Clamp on 11/04/2021 3:57 PM  Imaging: No results found.   PMFS History: Patient Active Problem List   Diagnosis Date Noted   Other atopic dermatitis 12/15/2021   Subacute cough 12/15/2021   Cervical radicular pain 11/09/2021   Cervical spine syndrome 11/09/2021   Chronic fatigue 10/22/2021   Epigastric abdominal pain 10/22/2021   Gastroesophageal reflux disease without esophagitis 10/22/2021   BPH (benign prostatic hyperplasia) 10/13/2021   Trigger finger of all digits of both hands 11/11/2020   Referred otalgia of right ear 07/10/2020   Need for vaccination 10/18/2019   Dermatitis 07/24/2019   Chronic pain of both knees 07/03/2019   Acute bacterial sinusitis 05/22/2019   Need for immunization against influenza 05/22/2019   Controlled substance agreement signed 01/30/2019   Cyst near coccyx 01/30/2019   Annual physical exam 01/16/2019   Generalized anxiety disorder 08/23/2018   Right elbow tendinitis 08/23/2018   Sprain of right shoulder 08/23/2018   Chronic prescription benzodiazepine use 06/14/2018   Mixed hyperlipidemia 06/14/2018   Epistaxis 09/13/2017   Recurrent URI (upper respiratory infection) 09/13/2017   Erectile dysfunction after radical prostatectomy 12/30/2016   Insomnia 12/09/2015   Pain, dental 12/09/2015   Anxiety 06/03/2015    BP (high blood pressure) 06/03/2015   Stage T1c adenocarcinoma of the prostate with a Gleason's score of 3+3 and a PSA of 9.6 11/09/2013   Past Medical History:  Diagnosis Date   Adenocarcinoma of prostate    stage T1c   Anxiety    Heart murmur    ASYMPTOMATIC   History of prostatitis    Hypertension     Family History  Problem Relation Age of Onset   Stroke Father    Healthy Mother    Healthy Maternal Grandmother    Throat cancer Maternal Grandfather    Pancreatic cancer Paternal Grandmother     Past Surgical History:  Procedure Laterality Date   INGUINAL HERNIA REPAIR Left 1980'S   PROSTATE BIOPSY     RADIOACTIVE SEED IMPLANT N/A 12/27/2013   Procedure: RADIOACTIVE SEED IMPLANT;  Surgeon: [name], MD;  Location[facility];  Service: Urology;  Laterality: N/A;  DR portable   TONSILLECTOMY  AGE 39'S   Social History   Occupational History   Occupation: Patient transporter  Tobacco Use   Smoking status: Never   Smokeless  tobacco: Never  Substance and Sexual Activity   Alcohol use: Yes    Comment: OCCASIONAL   Drug use: No   Sexual activity: Not on file

## 2022-11-17 ENCOUNTER — Ambulatory Visit: Payer: BC Managed Care – PPO | Admitting: Surgical

## 2022-11-17 ENCOUNTER — Ambulatory Visit: Payer: BC Managed Care – PPO | Admitting: Physical Therapy

## 2022-11-25 ENCOUNTER — Other Ambulatory Visit: Payer: Self-pay

## 2022-11-25 ENCOUNTER — Ambulatory Visit: Payer: BC Managed Care – PPO | Attending: Orthopedic Surgery

## 2022-11-25 DIAGNOSIS — M25561 Pain in right knee: Secondary | ICD-10-CM | POA: Insufficient documentation

## 2022-11-25 DIAGNOSIS — R2689 Other abnormalities of gait and mobility: Secondary | ICD-10-CM | POA: Insufficient documentation

## 2022-11-25 DIAGNOSIS — M6281 Muscle weakness (generalized): Secondary | ICD-10-CM | POA: Insufficient documentation

## 2022-11-25 DIAGNOSIS — M25562 Pain in left knee: Secondary | ICD-10-CM | POA: Insufficient documentation

## 2022-11-25 DIAGNOSIS — G8929 Other chronic pain: Secondary | ICD-10-CM | POA: Insufficient documentation

## 2022-11-25 NOTE — Therapy (Signed)
OUTPATIENT PHYSICAL THERAPY LOWER EXTREMITY EVALUATION   Patient Name: Alan Johnson MRN: [id number] DOB:03/08/1956, 67 y.o., male Today's Date: 11/25/2022  END OF SESSION:  PT End of Session - 11/25/22 1058     Visit Number 1    Date for PT Re-Evaluation 01/20/23    Authorization Type [employer]    PT Start Time 1017    PT Stop Time 1056    PT Time Calculation (min) 39 min    Activity Tolerance Patient tolerated treatment well    Behavior During Therapy WFL for tasks assessed/performed             Past Medical History:  Diagnosis Date   Adenocarcinoma of prostate    stage T1c   Anxiety    Heart murmur    ASYMPTOMATIC   History of prostatitis    Hypertension    Past Surgical History:  Procedure Laterality Date   INGUINAL HERNIA REPAIR Left 1980'S   PROSTATE BIOPSY     RADIOACTIVE SEED IMPLANT N/A 12/27/2013   Procedure: RADIOACTIVE SEED IMPLANT;  Surgeon: Anner Crete, MD;  Location: [facility];  Service: Urology;  Laterality: N/A;  DR portable   TONSILLECTOMY  AGE 75'S   Patient Active Problem List   Diagnosis Date Noted   Other atopic dermatitis 12/15/2021   Subacute cough 12/15/2021   Cervical radicular pain 11/09/2021   Cervical spine syndrome 11/09/2021   Chronic fatigue 10/22/2021   Epigastric abdominal pain 10/22/2021   Gastroesophageal reflux disease without esophagitis 10/22/2021   BPH (benign prostatic hyperplasia) 10/13/2021   Trigger finger of all digits of both hands 11/11/2020   Referred otalgia of right ear 07/10/2020   Need for vaccination 10/18/2019   Dermatitis 07/24/2019   Chronic pain of both knees 07/03/2019   Acute bacterial sinusitis 05/22/2019   Need for immunization against influenza 05/22/2019   Controlled substance agreement signed 01/30/2019   Cyst near coccyx 01/30/2019   Annual physical exam 01/16/2019   Generalized anxiety disorder 08/23/2018   Right elbow tendinitis 08/23/2018   Sprain of right shoulder 08/23/2018    Chronic prescription benzodiazepine use 06/14/2018   Mixed hyperlipidemia 06/14/2018   Epistaxis 09/13/2017   Recurrent URI (upper respiratory infection) 09/13/2017   Erectile dysfunction after radical prostatectomy 12/30/2016   Insomnia 12/09/2015   Pain, dental 12/09/2015   Anxiety 06/03/2015   BP (high blood pressure) 06/03/2015   Stage T1c adenocarcinoma of the prostate with a Gleason's score of 3+3 and a PSA of 9.6 11/09/2013    PCP: Wilfred Curtis, MD  REFERRING PROVIDER: Cammy Copa, MD  REFERRING DIAG:  Diagnosis  M25.561,M25.562,G89.29 (ICD-10-CM) - Chronic pain of both knees    THERAPY DIAG:  Chronic pain of left knee - Plan: PT plan of care cert/re-cert  Chronic pain of right knee - Plan: PT plan of care cert/re-cert  Muscle weakness (generalized) - Plan: PT plan of care cert/re-cert  Other abnormalities of gait and mobility - Plan: PT plan of care cert/re-cert  Rationale for Evaluation and Treatment: Rehabilitation  ONSET DATE: 5 years ago and has progressed recently  SUBJECTIVE:   SUBJECTIVE STATEMENT: Pt presents to PT with chronic knee pain.   From MD note on 11/09/22:  Patient reports activity related bilateral lower extremity and knee pain.  Would like to be referred for water therapy.  Pain does wake him from sleep at night.  Has had bilateral knee MRI scans which show mild arthritis in the medial compartment but no definite operative  pathology.   PERTINENT HISTORY: HTN, bil knee pain  PAIN: 11/25/22 Are you having pain? Yes: NPRS scale: 5-8/10 Pain location: bil knees  Pain description: dull Aggravating factors: night time, standing long periods (>15 minutes) Relieving factors: sitting/rest  PRECAUTIONS: None  WEIGHT BEARING RESTRICTIONS: No  FALLS:  Has patient fallen in last 6 months? No  LIVING ENVIRONMENT: Lives with: lives alone Lives in: House/apartment Stairs: No Has following equipment at home: None  OCCUPATION: retired.   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 Probable meniscal flounce of the posterior horn free edge.   Mild medial compartment osteoarthritis.   Small joint effusion.  Left knee:  Focal undersurface fraying of the posterior horn-body junction of the medial meniscus. No definitive meniscus tear.   Mild tricompartment osteoarthritis with areas of focal chondrosis in the lateral and patellofemoral compartments and low-grade partial-thickness cartilage loss in the medial compartment.   Small joint effusion.    PATIENT SURVEYS:  11/25/22: LEFS 41/80  COGNITION: Overall cognitive status: Within functional limits for tasks assessed     SENSATION: WFL   MUSCLE LENGTH: Lt hip limited by 25% vs Rt   POSTURE: flexed trunk   PALPATION: Tension in bil gastroc, hamstrings and quads.  No palpable pain today.  Crepitus and reduce mobility bil patella  LOWER EXTREMITY ROM: Rt hip limited by 25% vs Lt.  Rt=Lt knee ROM.  Extension lacking 5 degrees bil.    LOWER EXTREMITY MMT: 5/5 bil knee strength, hip flexion 4+/5, abduction 4/5    FUNCTIONAL TESTS:  11/25/22: 5 times sit to stand: 11.2 seconds  GAIT: Distance walked: 100 Assistive device utilized: None Level of assistance: Complete Independence Comments: symmetry with flexed trunk   TODAY'S TREATMENT:                                                                                                                              DATE: 11/25/22 HEP initiated-see below    PATIENT EDUCATION:  Education details: Access Code: [email or web address], aquatic info Person educated: Patient Education method: Explanation, Facilities manager, and Handouts Education comprehension: verbalized understanding and returned  demonstration  HOME EXERCISE PROGRAM: Access Code: [email or web address] URL: [email or web address] Date: 11/25/2022 Prepared by: Tresa Endo  Exercises - Seated Hamstring Stretch  - 3 x daily - 7 x weekly - 1 sets - 3 reps - 20 hold - Supine Figure 4 Piriformis Stretch  - 3 x daily - 7 x weekly - 1 sets - 3 reps - 20 hold - Seated Piriformis Stretch with Trunk Bend  - 1 x daily - 7 x weekly - 1 sets - 3 reps - 20 hold - Gastroc Stretch on Wall  - 3 x daily - 7 x weekly - 1 sets -  3 reps - 20 hold - Sit to Stand Without Arm Support  - 3 x daily - 7 x weekly - 2 sets - 10 reps  ASSESSMENT:  CLINICAL IMPRESSION: Patient is a 67 y.o. male who was seen today for physical therapy evaluation and treatment for bil knee pain. He has had pain x 5 years that has worsened in the past few months. He reports 5-8/10  bil knee pain that is worse at night with sleep and walking > 15 minutes.  Pt with tension in bil LE musculature.  Patient will benefit from skilled PT to address the below impairments and improve overall function.   OBJECTIVE IMPAIRMENTS: decreased activity tolerance, difficulty walking, decreased ROM, decreased strength, hypomobility, impaired flexibility, and pain.   ACTIVITY LIMITATIONS: squatting, sleeping, stairs, and locomotion level  PARTICIPATION LIMITATIONS: cleaning, shopping, and community activity  PERSONAL FACTORS: Age, Time since onset of injury/illness/exacerbation, and 1-2 comorbidities: bil knee OA, lumbar DDD  are also affecting patient's functional outcome.   REHAB POTENTIAL: Good  CLINICAL DECISION MAKING: Stable/uncomplicated  EVALUATION COMPLEXITY: Low   GOALS: Goals reviewed with patient? Yes  SHORT TERM GOALS: Target date: 12/23/2022   Be independent in initial HEP Baseline: Goal status: INITIAL  2.  Report > or = to 30% reduction in bil knee pain with standing and walking  Baseline:  Goal status: INITIAL  3.  Sleep with > or = to 25% fewer  interruptions due to knee pain  Baseline: difficulty falling asleep and 8/10 pain  Goal status: INITIAL   LONG TERM GOALS: Target date: 01/24/23  Be independent in advanced HEP Baseline:  Goal status: INITIAL  2.  Improve LEFS to > or = to 60/80 to improve function  Baseline: 41/80 Goal status: INITIAL  3.  Sleep with > or = to 50% fewer interruptions due to knee pain Baseline:  Goal status: INITIAL  4.  Stand and walk > or = to 25 minutes without limitation due to knee pain Baseline:  Goal status: INITIAL  5.  Report < or = to 4/10 knee pain with standing and walking  Baseline: 5-8/10 Goal status: INITIAL    PLAN:  PT FREQUENCY: 2-3x/week  PT DURATION: 8 weeks  PLANNED INTERVENTIONS: Therapeutic exercises, Therapeutic activity, Neuromuscular re-education, Balance training, Gait training, Patient/Family education, Self Care, Joint mobilization, Joint manipulation, Stair training, Aquatic Therapy, Dry Needling, Electrical stimulation, Cryotherapy, Moist heat, Taping, Vasopneumatic device, Manual therapy, and Re-evaluation  PLAN FOR NEXT SESSION: aquatics and land based exercise to address knee pain and OA   Lorrene Reid, PT 11/25/22 12:53 PM

## 2022-12-07 ENCOUNTER — Ambulatory Visit: Payer: BC Managed Care – PPO | Admitting: Rehabilitative and Restorative Service Providers"

## 2022-12-14 ENCOUNTER — Ambulatory Visit: Payer: BC Managed Care – PPO | Admitting: Physical Therapy

## 2022-12-15 ENCOUNTER — Ambulatory Visit (HOSPITAL_BASED_OUTPATIENT_CLINIC_OR_DEPARTMENT_OTHER): Payer: BC Managed Care – PPO | Admitting: Physical Therapy

## 2022-12-22 ENCOUNTER — Encounter: Payer: BC Managed Care – PPO | Admitting: Rehabilitative and Restorative Service Providers"

## 2022-12-24 ENCOUNTER — Ambulatory Visit (HOSPITAL_BASED_OUTPATIENT_CLINIC_OR_DEPARTMENT_OTHER): Payer: BC Managed Care – PPO | Admitting: Physical Therapy

## 2022-12-28 ENCOUNTER — Ambulatory Visit (HOSPITAL_BASED_OUTPATIENT_CLINIC_OR_DEPARTMENT_OTHER): Payer: BC Managed Care – PPO | Admitting: Physical Therapy

## 2022-12-30 ENCOUNTER — Encounter: Payer: BC Managed Care – PPO | Admitting: Rehabilitative and Restorative Service Providers"

## 2023-01-04 ENCOUNTER — Ambulatory Visit (HOSPITAL_BASED_OUTPATIENT_CLINIC_OR_DEPARTMENT_OTHER): Payer: BC Managed Care – PPO | Admitting: Physical Therapy

## 2023-01-06 ENCOUNTER — Encounter: Payer: BC Managed Care – PPO | Admitting: Rehabilitative and Restorative Service Providers"

## 2023-01-11 ENCOUNTER — Ambulatory Visit (HOSPITAL_BASED_OUTPATIENT_CLINIC_OR_DEPARTMENT_OTHER): Payer: BC Managed Care – PPO | Admitting: Physical Therapy

## 2023-01-13 ENCOUNTER — Encounter: Payer: BC Managed Care – PPO | Admitting: Rehabilitative and Restorative Service Providers"

## 2023-01-18 ENCOUNTER — Ambulatory Visit (HOSPITAL_BASED_OUTPATIENT_CLINIC_OR_DEPARTMENT_OTHER): Payer: BC Managed Care – PPO | Admitting: Physical Therapy

## 2023-01-25 ENCOUNTER — Ambulatory Visit (HOSPITAL_BASED_OUTPATIENT_CLINIC_OR_DEPARTMENT_OTHER): Payer: BC Managed Care – PPO | Admitting: Physical Therapy

## 2023-01-27 ENCOUNTER — Encounter: Payer: BC Managed Care – PPO | Admitting: Rehabilitative and Restorative Service Providers"

## 2023-02-01 ENCOUNTER — Ambulatory Visit (HOSPITAL_BASED_OUTPATIENT_CLINIC_OR_DEPARTMENT_OTHER): Payer: BC Managed Care – PPO | Admitting: Physical Therapy

## 2023-02-01 DIAGNOSIS — M79604 Pain in right leg: Secondary | ICD-10-CM | POA: Insufficient documentation

## 2023-08-16 DIAGNOSIS — H6993 Unspecified Eustachian tube disorder, bilateral: Secondary | ICD-10-CM | POA: Insufficient documentation

## 2023-08-21 IMAGING — MR MR KNEE*L* W/O CM
7 series · 40 of 40 positions shown · non-contrast
Comparison: Left knee radiograph 12/23/2021.

CLINICAL DATA: Left knee pain

EXAM:
MRI OF THE LEFT KNEE WITHOUT CONTRAST
TECHNIQUE: Multiplanar, multisequence MR imaging of the knee was performed. No
intravenous contrast was administered.

[Series 6: T2 fat-sat · axial · left · 4.0mm · 0.50mm/px · z∈[-58,+87]mm · 8 of 34 slices shown (1 of 3)]
[im 1/34]
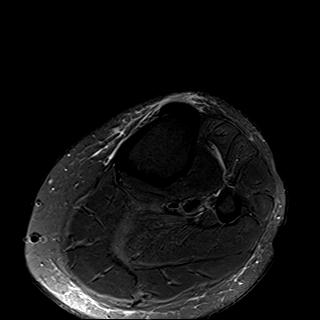
[im 5/34]
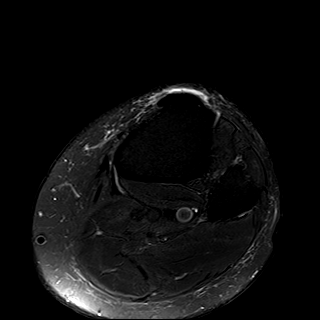
[im 10/34]
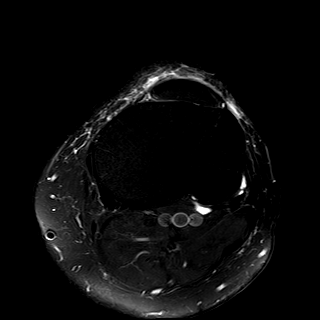
[im 15/34]
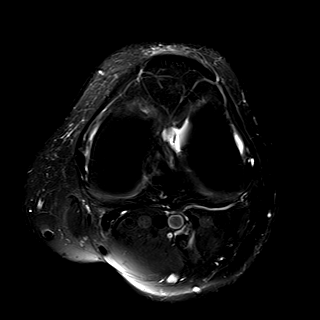
[im 19/34]
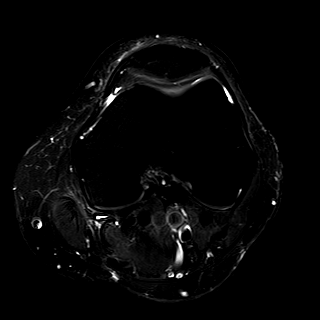
[im 24/34]
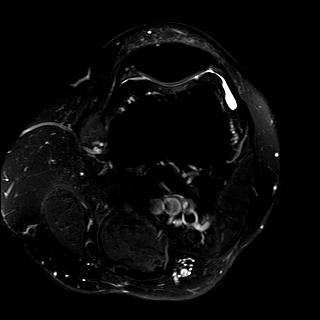
[im 29/34]
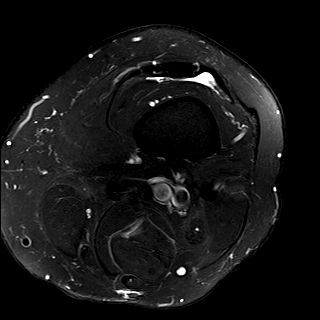
[im 34/34]
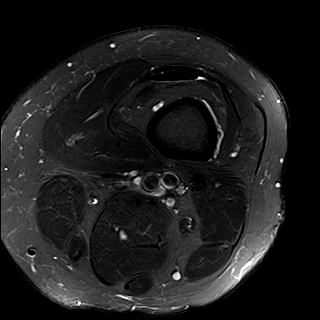

[Series 7: T2 fat-sat · coronal · left · 4.0mm · 0.47mm/px · 5 of 25 slices shown (2 of 3)]
[im 1/25]
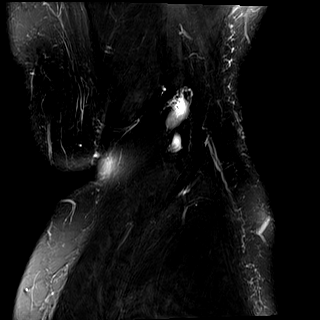
[im 7/25]
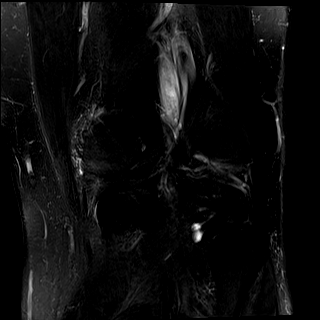
[im 13/25]
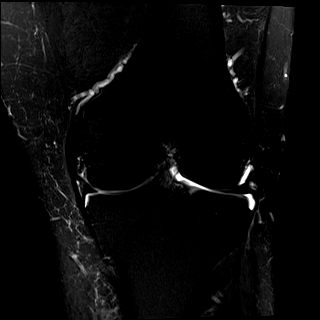
[im 19/25]
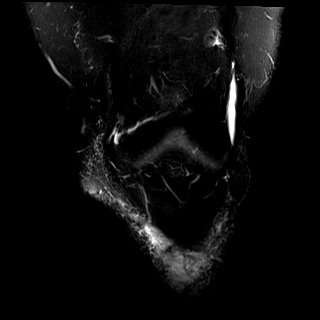
[im 25/25]
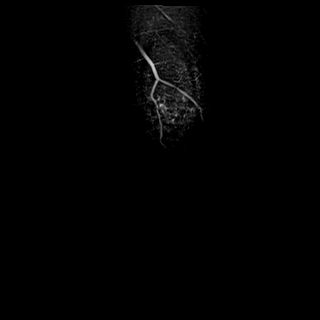

[Series 8: T1 · coronal · left · 4.0mm · 0.47mm/px · 5 of 25 slices shown]
[im 1/25]
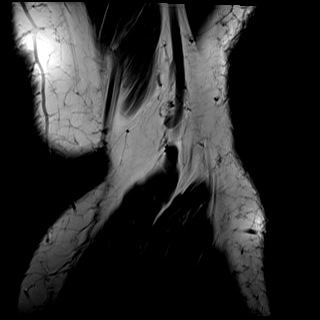
[im 7/25]
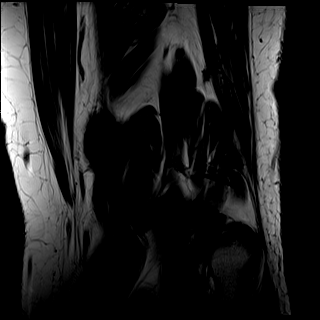
[im 13/25]
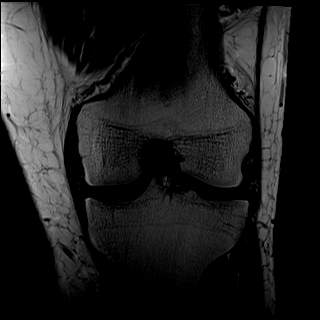
[im 19/25]
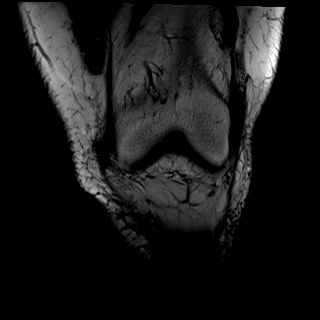
[im 25/25]
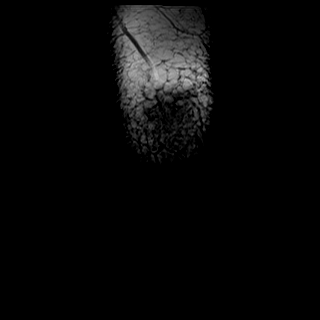

[Series 9: PD fat-sat · coronal · left · 3.0mm · 0.47mm/px · 6 of 30 slices shown (1 of 2)]
[im 1/30]
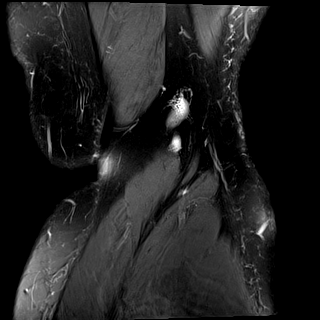
[im 6/30]
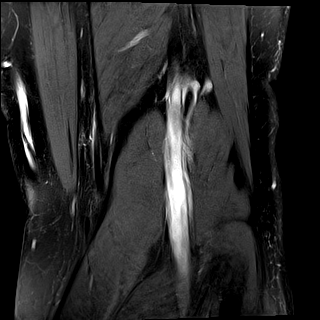
[im 12/30]
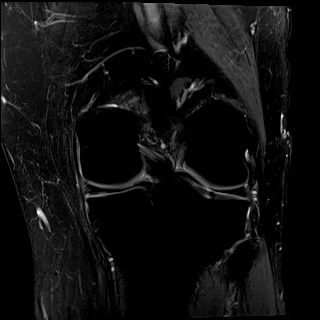
[im 18/30]
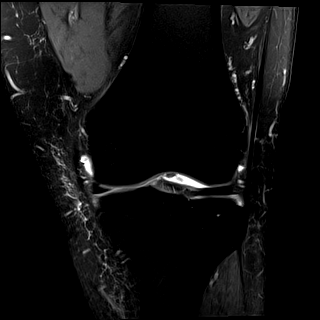
[im 24/30]
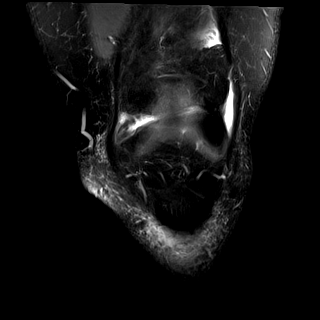
[im 30/30]
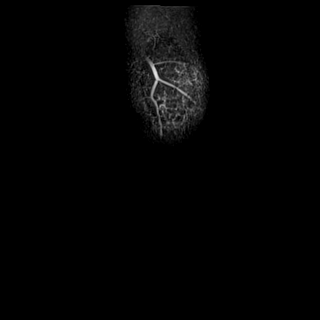

[Series 10: PD fat-sat · sagittal · left · 3.0mm · 0.39mm/px · 6 of 27 slices shown (2 of 2)]
[im 1/27]
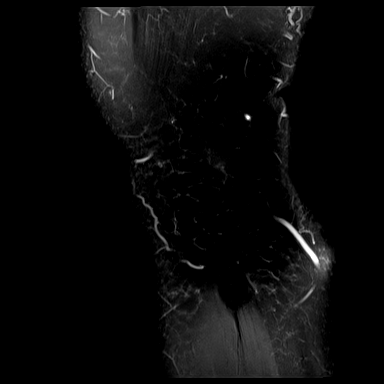
[im 6/27]
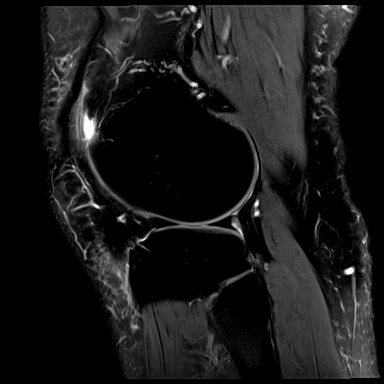
[im 11/27]
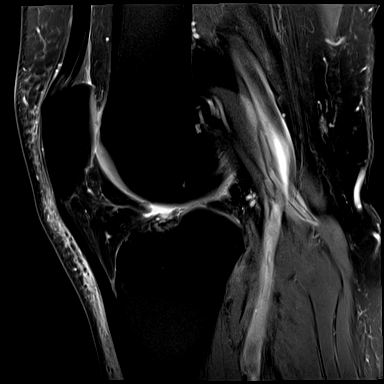
[im 16/27]
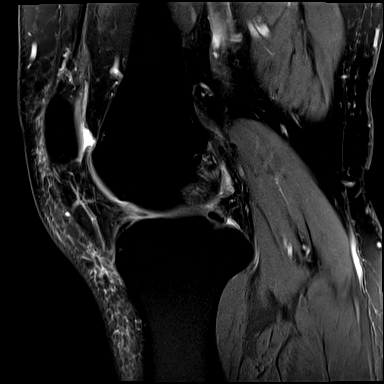
[im 21/27]
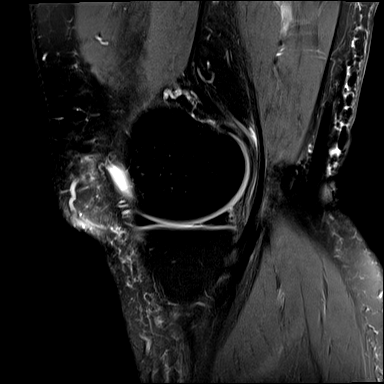
[im 27/27]
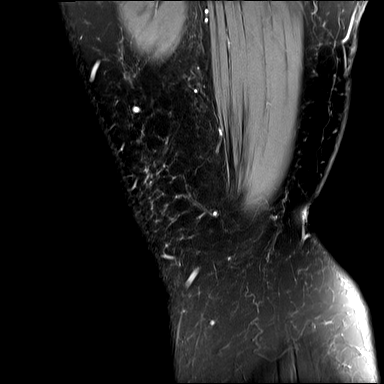

[Series 11: T2 fat-sat · sagittal · left · 3.0mm · 0.39mm/px · 6 of 27 slices shown (3 of 3)]
[im 1/27]
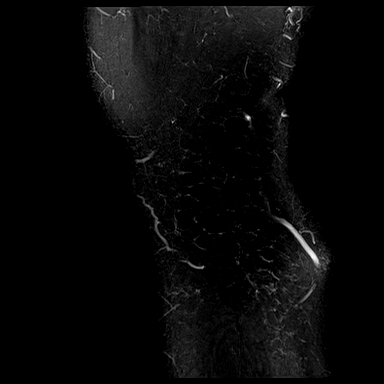
[im 6/27]
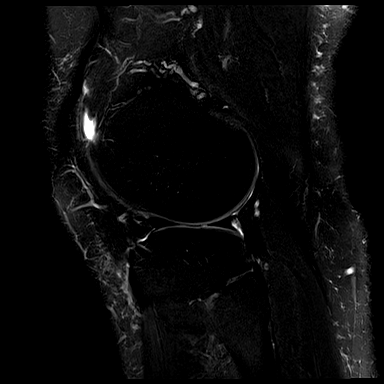
[im 11/27]
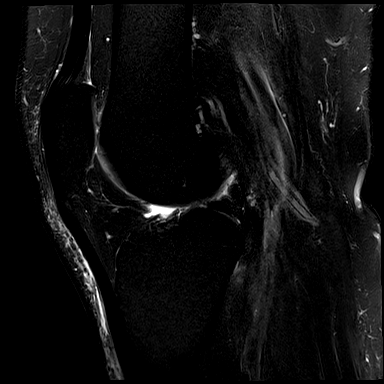
[im 16/27]
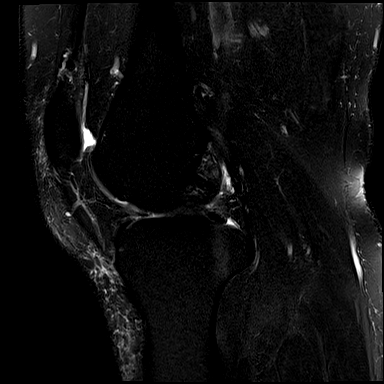
[im 21/27]
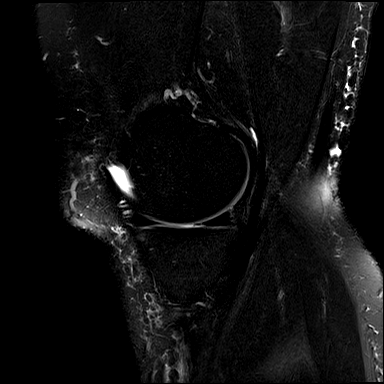
[im 27/27]
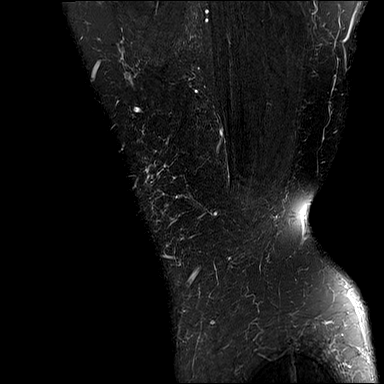

[Series 12: PD · coronal · left · 1.5mm · 0.44mm/px · 4 of 19 slices shown]
[im 1/19]
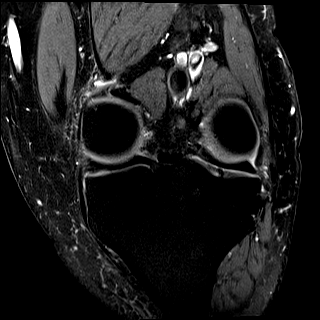
[im 7/19]
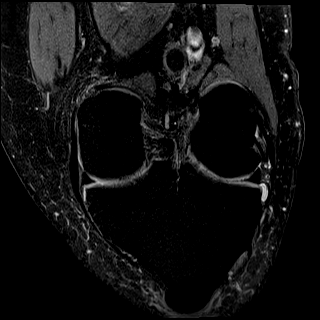
[im 13/19]
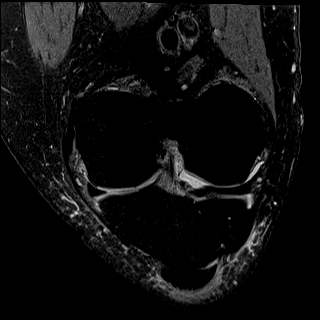
[im 19/19]
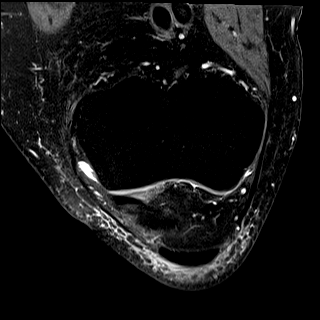

[40 of 40 positions shown; findings below may reference images not displayed]

FINDINGS: MENISCI

Medial: Focal undersurface fraying at the posterior horn-body
junction of the medial meniscus. No definitive tear.

Lateral: No lateral meniscus tear.

LIGAMENTS

Cruciates: ACL and PCL are intact.

Collaterals: There is thickening of the medial collateral ligament,
which could reflect a chronic sprain. No acute MCL tear. Lateral
collateral ligament complex is intact.

CARTILAGE

Patellofemoral: Mild focal chondrosis along the medial trochlea
(still sagittal T2 image 12, coronal PD image 21).

Medial:  Low-grade partial-thickness chondrosis.

Lateral: Focal mild chondrosis along the inner posterior aspect of
the weight-bearing tibial plateau (coronal PD image 12). Intact
subchondral bone plate and without subchondral marrow edema.

JOINT: Small joint effusion.

POPLITEAL FOSSA: Miniscule Baker's cyst.

EXTENSOR MECHANISM: Intact quadriceps tendon. Intact patellar
tendon.

BONES: No aggressive osseous lesion.  No evidence of fracture.

Other: Mild prepatellar soft tissue swelling. No fluid collection or
hematoma. Muscles are normal.
IMPRESSION: Focal undersurface fraying of the posterior horn-body junction of
the medial meniscus. No definitive meniscus tear.

Mild tricompartment osteoarthritis with areas of focal chondrosis in
the lateral and patellofemoral compartments and low-grade
partial-thickness cartilage loss in the medial compartment.

Small joint effusion.

## 2023-08-21 IMAGING — MR MR KNEE*R* W/O CM
7 series · 40 of 40 positions shown · non-contrast
Comparison: None Available.

CLINICAL DATA: Right knee pain

EXAM:
MRI OF THE RIGHT KNEE WITHOUT CONTRAST
TECHNIQUE: Multiplanar, multisequence MR imaging of the knee was performed. No
intravenous contrast was administered.

[Series 6: T2 fat-sat · axial · right · 4.0mm · 0.50mm/px · z∈[-59,+85]mm · 7 of 34 slices shown (1 of 3)]
[im 1/34]
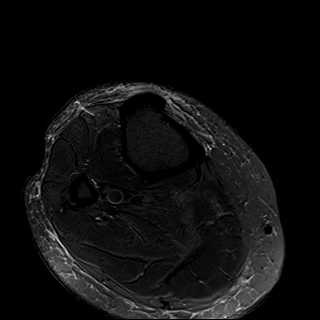
[im 6/34]
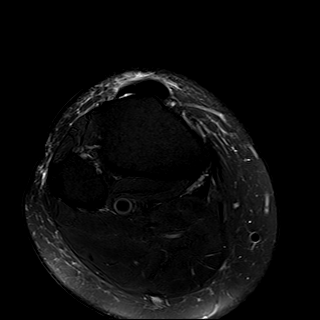
[im 12/34]
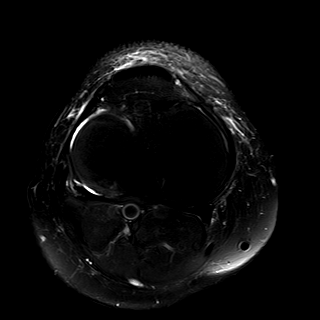
[im 17/34]
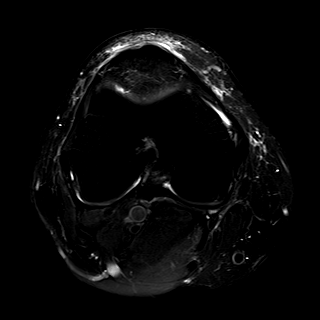
[im 23/34]
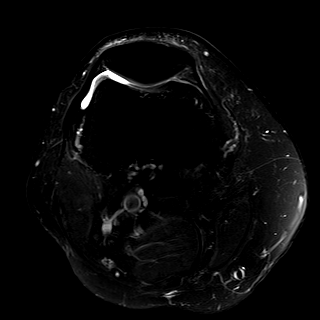
[im 28/34]
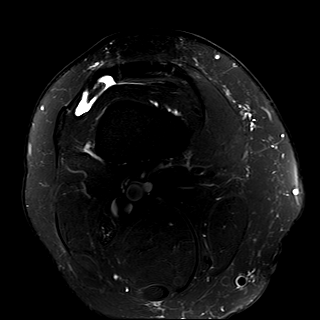
[im 34/34]
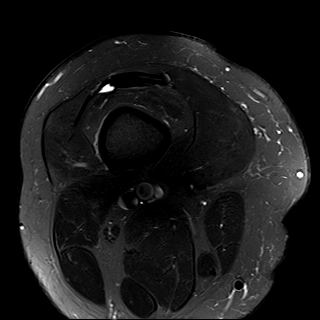

[Series 7: T2 fat-sat · coronal · right · 4.0mm · 0.47mm/px · 5 of 25 slices shown (2 of 3)]
[im 1/25]
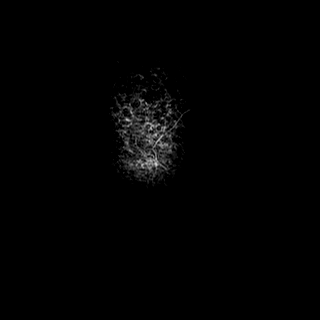
[im 7/25]
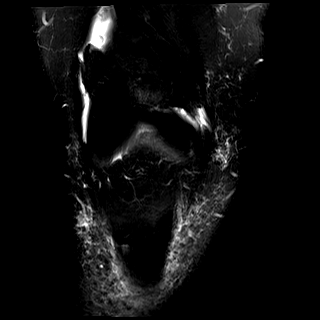
[im 13/25]
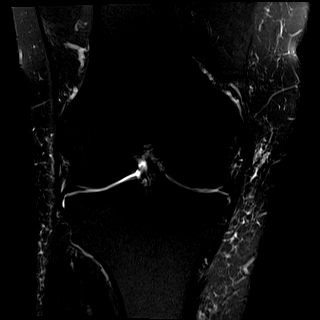
[im 19/25]
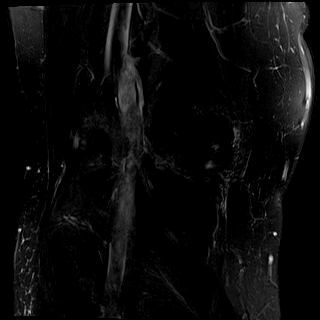
[im 25/25]
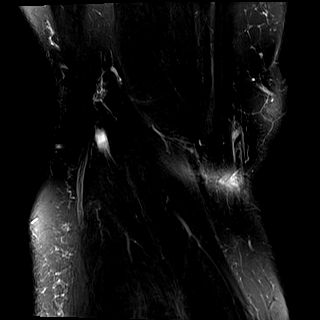

[Series 8: T2 fat-sat · sagittal · right · 3.0mm · 0.39mm/px · 6 of 27 slices shown (3 of 3)]
[im 1/27]
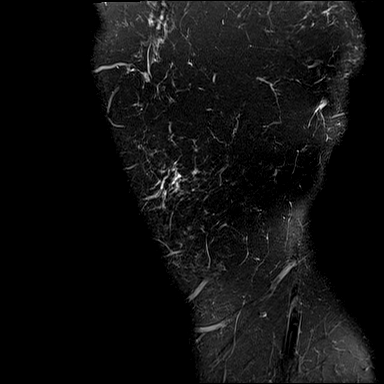
[im 6/27]
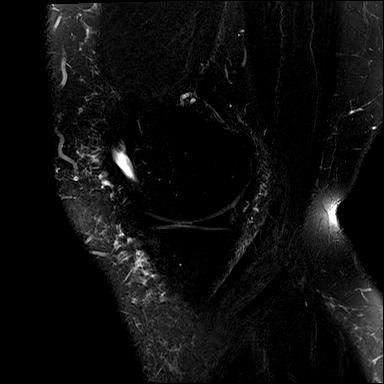
[im 11/27]
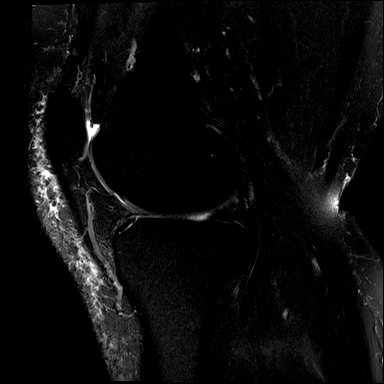
[im 16/27]
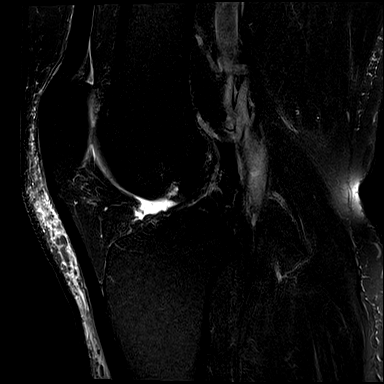
[im 21/27]
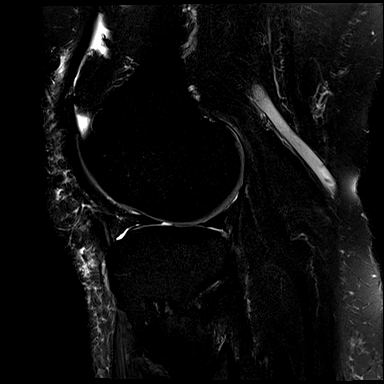
[im 27/27]
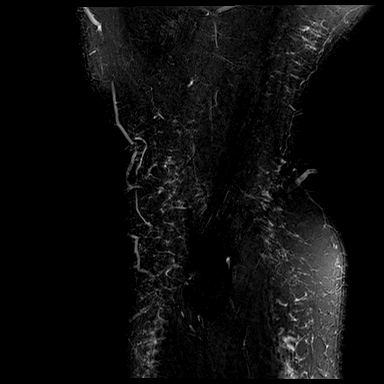

[Series 9: T1 · coronal · right · 4.0mm · 0.47mm/px · 5 of 25 slices shown]
[im 1/25]
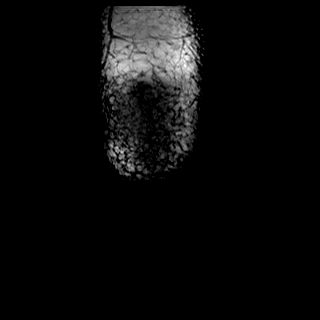
[im 7/25]
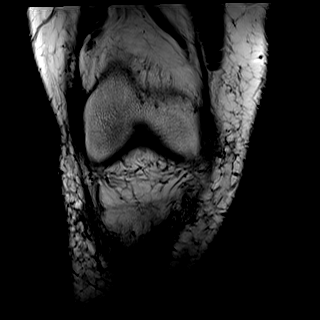
[im 13/25]
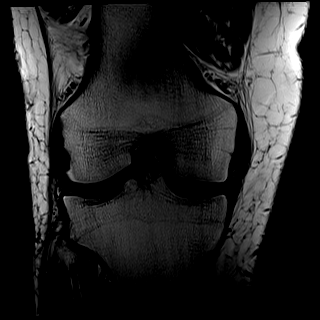
[im 19/25]
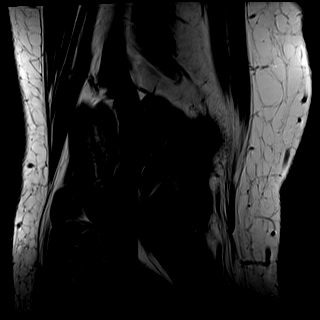
[im 25/25]
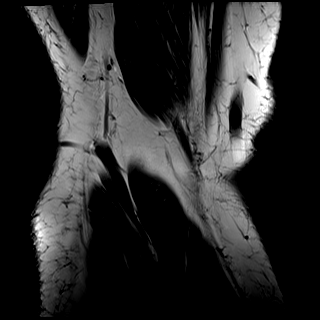

[Series 10: PD fat-sat · coronal · right · 3.0mm · 0.47mm/px · 7 of 31 slices shown (1 of 2)]
[im 1/31]
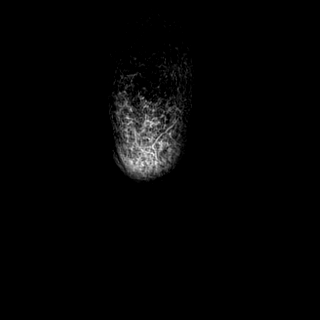
[im 6/31]
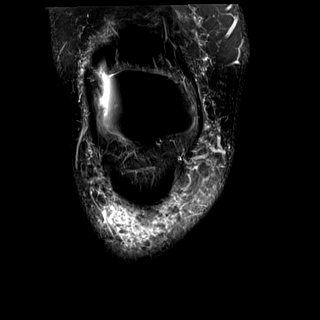
[im 11/31]
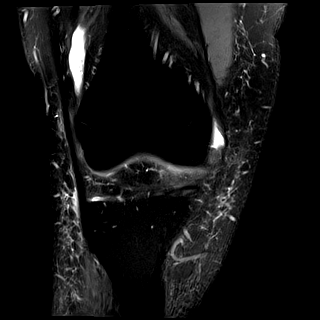
[im 16/31]
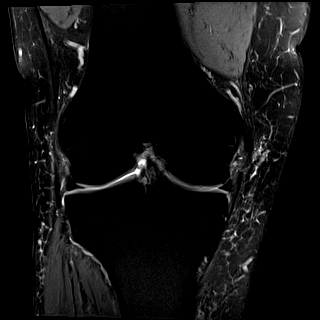
[im 21/31]
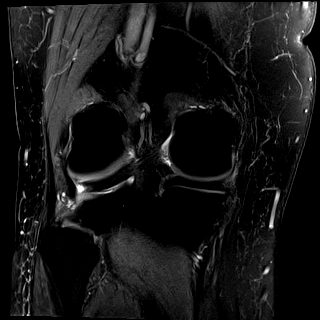
[im 26/31]
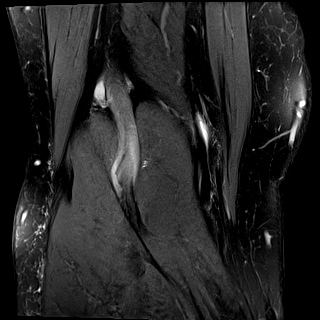
[im 31/31]
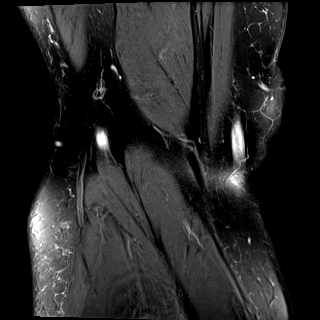

[Series 11: PD fat-sat · sagittal · right · 3.0mm · 0.39mm/px · 6 of 27 slices shown (2 of 2)]
[im 1/27]
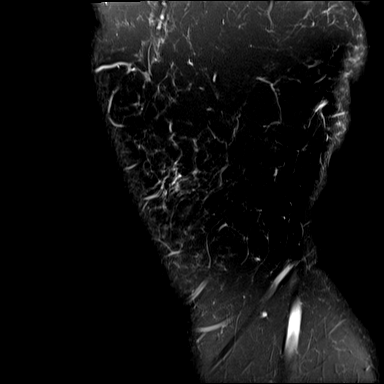
[im 6/27]
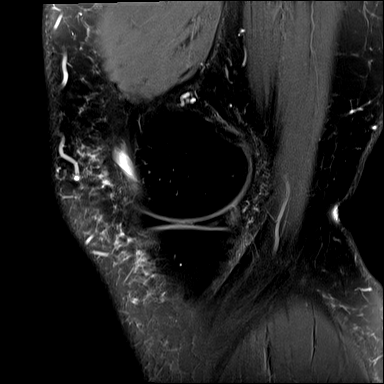
[im 11/27]
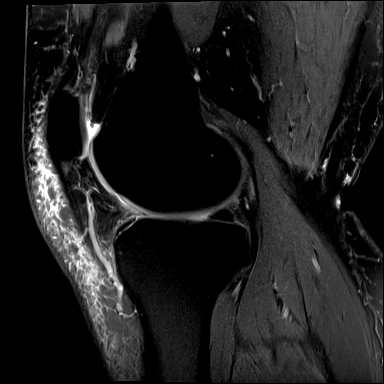
[im 16/27]
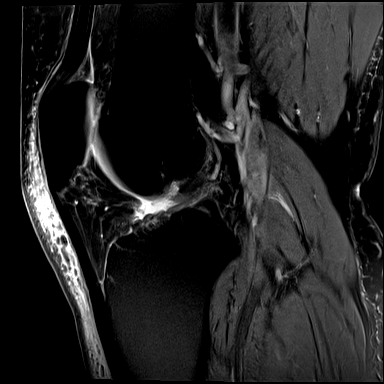
[im 21/27]
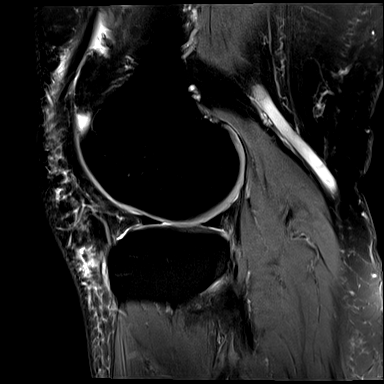
[im 27/27]
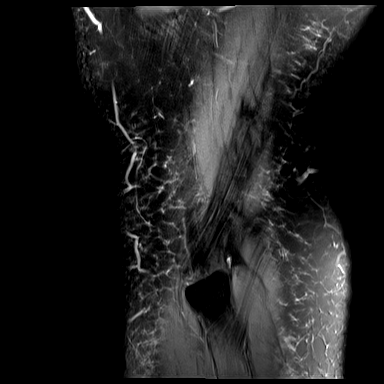

[Series 12: PD · coronal · right · 1.5mm · 0.44mm/px · 4 of 19 slices shown]
[im 1/19]
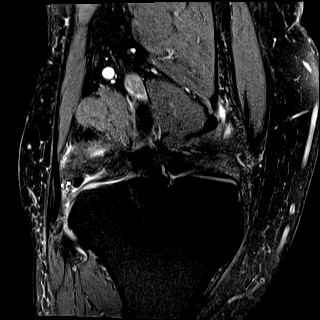
[im 7/19]
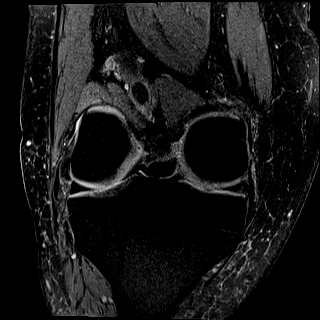
[im 13/19]
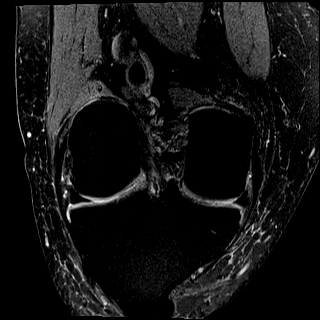
[im 19/19]
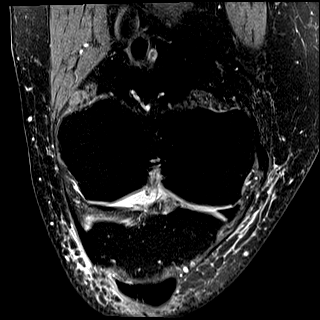

[40 of 40 positions shown; findings below may reference images not displayed]

FINDINGS: MENISCI

Medial: Intrasubstance degeneration without definitive medial
meniscus tear. There is a meniscal flounce of the posterior horn.

Lateral: Intact.

LIGAMENTS

Cruciates: ACL and PCL are intact.

Collaterals: Mildly thickened proximal MCL, which could reflect a
chronic sprain. No acute MCL injury. Lateral collateral ligament
complex is intact.

CARTILAGE

Patellofemoral:  No chondral defect.

Medial: Mild partial-thickness cartilage loss along the
weight-bearing surfaces.

Lateral:  No chondral defect.

JOINT: Small joint effusion.

POPLITEAL FOSSA: Miniscule Baker's cyst.

EXTENSOR MECHANISM: Intact quadriceps tendon. Intact patellar
tendon.

BONES: No aggressive osseous lesion. No fracture or dislocation.

Other: Mild generalized patellar soft tissue swelling. No fluid
collection or hematoma. Muscles are normal.
IMPRESSION: Intrasubstance degeneration of the medial meniscus without
definitive meniscus tear. Probable meniscal flounce of the posterior
horn free edge.

Mild medial compartment osteoarthritis.

Small joint effusion.

## 2023-08-29 ENCOUNTER — Ambulatory Visit
Admission: EM | Admit: 2023-08-29 | Discharge: 2023-08-29 | Disposition: A | Discharge status: Home / Self Care | Payer: Medicare HMO | Attending: Internal Medicine | Admitting: Internal Medicine

## 2023-08-29 DIAGNOSIS — Z20822 Contact with and (suspected) exposure to covid-19: Secondary | ICD-10-CM | POA: Diagnosis not present

## 2023-08-29 DIAGNOSIS — H6992 Unspecified Eustachian tube disorder, left ear: Secondary | ICD-10-CM | POA: Diagnosis present

## 2023-08-29 LAB — SARS CORONAVIRUS 2 BY RT PCR: SARS Coronavirus 2 by RT PCR: NEGATIVE

## 2023-08-29 NOTE — ED Triage Notes (Signed)
Patient here today with c/o left ear pain X 2 weeks. Patient was recently prescribed Prednisone and Amoxicillin for right ear infection.

## 2023-08-29 NOTE — Discharge Instructions (Signed)
Use the Flonase or Nasacort for 7 days

## 2023-08-29 NOTE — ED Provider Notes (Signed)
[facility]    CSN: [id number] Arrival date & time: 08/29/23  1154      History   Chief Complaint Chief Complaint  Patient presents with   Otalgia    HPI Alan Johnson is a 68 y.o. male. presents with R ear irritation, like an echo  off and on Had R OM and finished  all the medication last week. Denies fever. Has been around a lot of sick people and would like a covid test. He denies having fever, chills, sweats, cough, rhinitis.     Past Medical History:  Diagnosis Date   Adenocarcinoma of prostate (HCC)    stage T1c   Anxiety    Heart murmur    ASYMPTOMATIC   History of prostatitis    Hypertension     Patient Active Problem List   Diagnosis Date Noted   Other atopic dermatitis 12/15/2021   Subacute cough 12/15/2021   Cervical radicular pain 11/09/2021   Cervical spine syndrome 11/09/2021   Chronic fatigue 10/22/2021   Epigastric abdominal pain 10/22/2021   Gastroesophageal reflux disease without esophagitis 10/22/2021   BPH (benign prostatic hyperplasia) 10/13/2021   Trigger finger of all digits of both hands 11/11/2020   Referred otalgia of right ear 07/10/2020   Need for vaccination 10/18/2019   Dermatitis 07/24/2019   Chronic pain of both knees 07/03/2019   Acute bacterial sinusitis 05/22/2019   Need for immunization against influenza 05/22/2019   Controlled substance agreement signed 01/30/2019   Cyst near coccyx 01/30/2019   Annual physical exam 01/16/2019   Generalized anxiety disorder 08/23/2018   Right elbow tendinitis 08/23/2018   Sprain of right shoulder 08/23/2018   Chronic prescription benzodiazepine use 06/14/2018   Mixed hyperlipidemia 06/14/2018   Epistaxis 09/13/2017   Recurrent URI (upper respiratory infection) 09/13/2017   Erectile dysfunction after radical prostatectomy 12/30/2016   Insomnia 12/09/2015   Pain, dental 12/09/2015   Anxiety 06/03/2015   BP (high blood pressure) 06/03/2015   Malignant neoplasm of prostate  with intermediate recurrence risk, stage T2B-C or Gleason 7 or prostate-specific antigen (PSA) 10-20 (HCC) 11/09/2013    Past Surgical History:  Procedure Laterality Date   INGUINAL HERNIA REPAIR Left 1980'S   PROSTATE BIOPSY     RADIOACTIVE SEED IMPLANT N/A 12/27/2013   Procedure: RADIOACTIVE SEED IMPLANT;  Surgeon: Anner Crete, MD;  Location: [facility];  Service: Urology;  Laterality: N/A;  DR portable   TONSILLECTOMY  AGE 58'S       Home Medications    Prior to Admission medications   Medication Sig Start Date End Date Taking? Authorizing Provider  ALPRAZolam Prudy Feeler) 1 MG tablet Take 1 mg by mouth daily as needed. 07/23/21   [provider]  amLODipine (NORVASC) 5 MG tablet Take 1 tablet (5 mg total) by mouth every morning. Overdue for annual appt w/labs must see MD for future refills 02/10/17   Veryl Speak, FNP  aspirin 81 MG tablet Take 81 mg by mouth daily.    [provider]  atorvastatin (LIPITOR) 20 MG tablet Take 20 mg by mouth daily. 04/20/19   [provider]  celecoxib (CELEBREX) 100 MG capsule Take 1 capsule (100 mg total) by mouth 2 (two) times daily. 01/21/22   Magnant, Charles L, PA-C  clotrimazole-betamethasone (LOTRISONE) cream APPLY TO AFFECTED AREA TWICE A DAY 05/22/19   [provider]  dutasteride (AVODART) 0.5 MG capsule Take 0.5 mg by mouth daily. 05/25/17   [provider]  econazole  nitrate 1 % cream APPLY TO AFFECTED AREA TOPICALLY TWO TIMES A DAY 10/26/22   Hyatt, Max T, DPM  gabapentin (NEURONTIN) 100 MG capsule Take 100 mg by mouth daily. 12/23/21   [provider]  lansoprazole (PREVACID) 30 MG capsule Take 30 mg by mouth daily. 12/16/21   [provider]  NEOMYCIN-POLYMYXIN-HYDROCORTISONE (CORTISPORIN) 1 % SOLN OTIC solution Apply 1-2 drops to toe BID after soaking 10/14/21   Hyatt, Max T, DPM  sildenafil (VIAGRA) 50 MG tablet use as directed, take 1-5 tablets BY MOUTH AS NEEDED  08/04/17   [provider]  tamsulosin (FLOMAX) 0.4 MG CAPS capsule Take 1 capsule (0.4 mg total) by mouth daily. 12/27/13   Bjorn Pippin, MD    Family History Family History  Problem Relation Age of Onset   Stroke Father    Healthy Mother    Healthy Maternal Grandmother    Throat cancer Maternal Grandfather    Pancreatic cancer Paternal Grandmother     Social History Social History   Tobacco Use   Smoking status: Never   Smokeless tobacco: Never  Substance Use Topics   Alcohol use: Yes    Comment: OCCASIONAL   Drug use: No     Allergies   Patient has no known allergies.   Review of Systems Review of Systems As noted in HPI  Physical Exam Triage Vital Signs ED Triage Vitals  Encounter Vitals Group     BP 08/29/23 1322 (!) 143/85     Systolic BP Percentile --      Diastolic BP Percentile --      Pulse Rate 08/29/23 1322 62     Resp 08/29/23 1322 16     Temp 08/29/23 1322 97.8 F (36.6 C)     Temp Source 08/29/23 1322 Oral     SpO2 08/29/23 1322 94 %     Weight 08/29/23 1323 204 lb (92.5 kg)     Height 08/29/23 1323 5\' 7"  (1.702 m)     Head Circumference --      Peak Flow --      Pain Score 08/29/23 1323 8     Pain Loc --      Pain Education --      Exclude from Growth Chart --    No data found.  Updated Vital Signs BP (!) 143/85 (BP Location: Right Arm)   Pulse 62   Temp 97.8 F (36.6 C) (Oral)   Resp 16   Ht 5\' 7"  (1.702 m)   Wt 204 lb (92.5 kg)   SpO2 94%   BMI 31.95 kg/m   Visual Acuity Right Eye Distance:   Left Eye Distance:   Bilateral Distance:    Right Eye Near:   Left Eye Near:    Bilateral Near:     Physical Exam Vitals and nursing note reviewed.  Constitutional:      General: He is not in acute distress.    Appearance: He is obese. He is not ill-appearing or toxic-appearing.  HENT:     Right Ear: Tympanic membrane, ear canal and external ear normal.     Left Ear: Tympanic membrane, ear canal and external ear  normal.  Eyes:     General: No scleral icterus.    Conjunctiva/sclera: Conjunctivae normal.  Musculoskeletal:     Cervical back: Neck supple.  Lymphadenopathy:     Cervical: No cervical adenopathy.  Neurological:     Mental Status: He is alert and oriented to person, place, and time.  Gait: Gait normal.  Psychiatric:        Mood and Affect: Mood normal.        Behavior: Behavior normal.        Thought Content: Thought content normal.        Judgment: Judgment normal.      UC Treatments / Results  Labs (all labs ordered are listed, but only abnormal results are displayed) Labs Reviewed  SARS CORONAVIRUS 2 BY RT PCR    EKG   Radiology No results found.  Procedures Procedures (including critical care time)  Medications Ordered in UC Medications - No data to display  Initial Impression / Assessment and Plan / UC Course  I have reviewed the triage vital signs and the nursing notes.  Pertinent labs & imaging results that were available during my care of the patient were reviewed by me and considered in my medical decision making (see chart for details).     *** Final Clinical Impressions(s) / UC Diagnoses   Final diagnoses:  Contact with and (suspected) exposure to covid-19   Discharge Instructions   None    ED Prescriptions   None    PDMP not reviewed this encounter.

## 2024-01-23 DIAGNOSIS — E291 Testicular hypofunction: Secondary | ICD-10-CM | POA: Insufficient documentation

## 2024-01-23 DIAGNOSIS — G894 Chronic pain syndrome: Secondary | ICD-10-CM | POA: Insufficient documentation

## 2024-01-31 ENCOUNTER — Telehealth: Payer: Self-pay | Admitting: Orthopedic Surgery

## 2024-06-11 ENCOUNTER — Encounter: Payer: Self-pay | Admitting: Radiology

## 2024-07-16 ENCOUNTER — Ambulatory Visit

## 2024-07-16 ENCOUNTER — Ambulatory Visit: Admission: EM | Admit: 2024-07-16 | Discharge: 2024-07-16 | Disposition: A | Discharge status: Home / Self Care

## 2024-07-16 ENCOUNTER — Encounter: Payer: Self-pay | Admitting: Emergency Medicine

## 2024-07-16 DIAGNOSIS — S92521A Displaced fracture of medial phalanx of right lesser toe(s), initial encounter for closed fracture: Secondary | ICD-10-CM

## 2024-07-16 MED ORDER — TRAMADOL HCL 50 MG PO TABS: 50.0000 mg | ORAL_TABLET | Freq: Four times a day (QID) | ORAL | 0 refills | Status: AC | PRN

## 2024-07-16 NOTE — ED Triage Notes (Signed)
 Pt presents c/o 4th and 5th toe injury x 4 days. Pt states,  I think I broke or sprained my toe. I was frying chicken and walked into the door.

## 2024-07-16 NOTE — ED Provider Notes (Signed)
 [facility]    CSN: [id number] Arrival date & time: 07/16/24  1435      History   Chief Complaint Chief Complaint  Patient presents with   Toe Injury    HPI Alan Johnson is a 68 y.o. male.   Pt presents today due to 4 days of marked right 4th and 5th toe pain after stubbing his toe against the wall while frying chicken. Pt denies use of anything for pain, concerned for fracture.   The history is provided by the patient.    Past Medical History:  Diagnosis Date   Adenocarcinoma of prostate (HCC)    stage T1c   Anxiety    Heart murmur    ASYMPTOMATIC   History of prostatitis    Hypertension     Patient Active Problem List   Diagnosis Date Noted   Chronic pain syndrome 01/23/2024   Hypogonadism in male 01/23/2024   Eustachian tube disorder, bilateral 08/16/2023   Pain in both lower extremities 02/01/2023   Osteoarthritis of spine with radiculopathy, cervical region 01/20/2022   Other atopic dermatitis 12/15/2021   Subacute cough 12/15/2021   Cervical radicular pain 11/09/2021   Cervical spine syndrome 11/09/2021   Chronic fatigue 10/22/2021   Epigastric abdominal pain 10/22/2021   Gastroesophageal reflux disease without esophagitis 10/22/2021   BPH (benign prostatic hyperplasia) 10/13/2021   Trigger finger of all digits of both hands 11/11/2020   Referred otalgia of right ear 07/10/2020   Need for vaccination 10/18/2019   Dermatitis 07/24/2019   Chronic pain of both knees 07/03/2019   Acute bacterial sinusitis 05/22/2019   Need for immunization against influenza 05/22/2019   Controlled substance agreement signed 01/30/2019   Cyst near coccyx 01/30/2019   Annual physical exam 01/16/2019   Generalized anxiety disorder 08/23/2018   Right elbow tendinitis 08/23/2018   Sprain of right shoulder 08/23/2018   Chronic prescription benzodiazepine use 06/14/2018   Mixed hyperlipidemia 06/14/2018   Epistaxis 09/13/2017   Recurrent URI (upper respiratory  infection) 09/13/2017   Erectile dysfunction after radical prostatectomy 12/30/2016   Insomnia 12/09/2015   Pain, dental 12/09/2015   Anxiety 06/03/2015   BP (high blood pressure) 06/03/2015   Malignant neoplasm of prostate with intermediate recurrence risk, stage T2B-C or Gleason 7 or prostate-specific antigen (PSA) 10-20 (HCC) 11/09/2013   History of prostate cancer 11/09/2013    Past Surgical History:  Procedure Laterality Date   INGUINAL HERNIA REPAIR Left 1980'S   PROSTATE BIOPSY     RADIOACTIVE SEED IMPLANT N/A 12/27/2013   Procedure: RADIOACTIVE SEED IMPLANT;  Surgeon: Norleen JINNY Seltzer, MD;  Location: [facility];  Service: Urology;  Laterality: N/A;  DR portable   TONSILLECTOMY  AGE 37'S       Home Medications    Prior to Admission medications   Medication Sig Start Date End Date Taking? Authorizing Provider  amoxicillin-clavulanate (AUGMENTIN) 875-125 MG tablet Take 1 tablet by mouth. 01/18/18  Yes [provider]  Azelastine HCl 137 MCG/SPRAY SOLN INSTILL 1 SPRAY INTO BOTH NOSTRILS TWICE A DAY AS DIRECTED 09/08/23  Yes [provider]  diclofenac Sodium (VOLTAREN) 1 % GEL Apply topically. 08/16/23  Yes [provider]  latanoprost (XALATAN) 0.005 % ophthalmic solution Apply to eye. 01/17/23  Yes [provider]  traMADol  (ULTRAM ) 50 MG tablet Take 1 tablet (50 mg total) by mouth every 6 (six) hours as needed. 07/16/24  Yes Andra Krabbe C, PA-C  zolpidem  (AMBIEN ) 5 MG tablet Take 5 mg by  mouth. 01/31/24  Yes [provider]  ALPRAZolam  (XANAX ) 1 MG tablet Take 1 mg by mouth daily as needed. 07/23/21   [provider]  amLODipine  (NORVASC ) 5 MG tablet Take 1 tablet (5 mg total) by mouth every morning. Overdue for annual appt w/labs must see MD for future refills 02/10/17   Calone, Gregory D, FNP  aspirin 81 MG tablet Take 81 mg by mouth daily.    [provider]  atorvastatin (LIPITOR) 20 MG tablet Take  20 mg by mouth daily. 04/20/19   [provider]  celecoxib  (CELEBREX ) 100 MG capsule Take 1 capsule (100 mg total) by mouth 2 (two) times daily. 01/21/22   Magnant, Charles L, PA-C  clotrimazole-betamethasone (LOTRISONE) cream APPLY TO AFFECTED AREA TWICE A DAY 05/22/19   [provider]  dutasteride (AVODART) 0.5 MG capsule Take 0.5 mg by mouth daily. 05/25/17   [provider]  econazole nitrate  1 % cream APPLY TO AFFECTED AREA TOPICALLY TWO TIMES A DAY 10/26/22   Hyatt, Max T, DPM  gabapentin (NEURONTIN) 100 MG capsule Take 100 mg by mouth daily. 12/23/21   [provider]  lansoprazole (PREVACID) 30 MG capsule Take 30 mg by mouth daily. 12/16/21   [provider]  NEOMYCIN -POLYMYXIN-HYDROCORTISONE (CORTISPORIN) 1 % SOLN OTIC solution Apply 1-2 drops to toe BID after soaking 10/14/21   Hyatt, Max T, DPM  sildenafil  (VIAGRA ) 50 MG tablet use as directed, take 1-5 tablets BY MOUTH AS NEEDED 08/04/17   [provider]  tamsulosin  (FLOMAX ) 0.4 MG CAPS capsule Take 1 capsule (0.4 mg total) by mouth daily. 12/27/13   Watt Rush, MD  triamcinolone ointment (KENALOG) 0.5 % Apply topically.    [provider]    Family History Family History  Problem Relation Age of Onset   Stroke Father    Healthy Mother    Healthy Maternal Grandmother    Throat cancer Maternal Grandfather    Pancreatic cancer Paternal Grandmother     Social History Social History   Tobacco Use   Smoking status: Never    Passive exposure: Never   Smokeless tobacco: Never  Vaping Use   Vaping status: Never Used  Substance Use Topics   Alcohol use: Yes    Comment: OCCASIONAL   Drug use: No     Allergies   Patient has no known allergies.   Review of Systems Review of Systems   Physical Exam Triage Vital Signs ED Triage Vitals  Encounter Vitals Group     BP 07/16/24 1603 (!) 172/97     Girls Systolic BP Percentile --      Girls Diastolic BP  Percentile --      Boys Systolic BP Percentile --      Boys Diastolic BP Percentile --      Pulse Rate 07/16/24 1603 65     Resp 07/16/24 1603 18     Temp 07/16/24 1603 98.3 F (36.8 C)     Temp Source 07/16/24 1603 Oral     SpO2 07/16/24 1603 97 %     Weight 07/16/24 1602 203 lb 14.8 oz (92.5 kg)     Height --      Head Circumference --      Peak Flow --      Pain Score 07/16/24 1601 8     Pain Loc --      Pain Education --      Exclude from Growth Chart --    No data found.  Updated Vital Signs BP (!) 172/97 (BP Location: Left Arm)   Pulse 65   Temp 98.3 F (36.8 C) (Oral)   Resp 18   Wt 203 lb 14.8 oz (92.5 kg)   SpO2 97%   BMI 31.94 kg/m   Visual Acuity Right Eye Distance:   Left Eye Distance:   Bilateral Distance:    Right Eye Near:   Left Eye Near:    Bilateral Near:     Physical Exam Vitals and nursing note reviewed.  Constitutional:      General: He is not in acute distress.    Appearance: Normal appearance. He is not ill-appearing, toxic-appearing or diaphoretic.  Eyes:     General: No scleral icterus. Cardiovascular:     Rate and Rhythm: Normal rate and regular rhythm.     Heart sounds: Normal heart sounds.  Pulmonary:     Effort: Pulmonary effort is normal. No respiratory distress.     Breath sounds: Normal breath sounds. No wheezing or rhonchi.  Musculoskeletal:     Comments: Tenderness and mild swelling noted of fifth toe of right foot  Skin:    General: Skin is warm.  Neurological:     Mental Status: He is alert and oriented to person, place, and time.  Psychiatric:        Mood and Affect: Mood normal.        Behavior: Behavior normal.      UC Treatments / Results  Labs (all labs ordered are listed, but only abnormal results are displayed) Labs Reviewed - No data to display  EKG   Radiology DG Toe 4th Right Result Date: 07/16/2024 CLINICAL DATA:  Injury to fourth and fifth toes, hit on door EXAM: RIGHT FOURTH TOE; RIGHT FIFTH  TOE COMPARISON:  None Available. FINDINGS: Frontal, oblique, and lateral views of the fourth and fifth digits are obtained on 6 images. There is an oblique minimally displaced extra-articular fracture through the fifth proximal phalanx, with near anatomic alignment. Soft tissue swelling throughout the lateral aspect of the forefoot and fifth digit. No acute fourth digit fracture. Alignment is anatomic. Soft tissues are unremarkable. IMPRESSION: 1. Acute oblique extra-articular fifth proximal phalangeal fracture, with near anatomic alignment. 2. Soft tissue swelling of the lateral forefoot and fifth digit. Electronically Signed   By: Ozell Daring M.D.   On: 07/16/2024 17:22   DG Toe 5th Right Result Date: 07/16/2024 CLINICAL DATA:  Injury to fourth and fifth toes, hit on door EXAM: RIGHT FOURTH TOE; RIGHT FIFTH TOE COMPARISON:  None Available. FINDINGS: Frontal, oblique, and lateral views of the fourth and fifth digits are obtained on 6 images. There is an oblique minimally displaced extra-articular fracture through the fifth proximal phalanx, with near anatomic alignment. Soft tissue swelling throughout the lateral aspect of the forefoot and fifth digit. No acute fourth digit fracture. Alignment is anatomic. Soft tissues are unremarkable. IMPRESSION: 1. Acute oblique extra-articular fifth proximal phalangeal fracture, with near anatomic alignment. 2. Soft tissue swelling of the lateral forefoot and fifth digit. Electronically Signed   By: Ozell Daring M.D.   On: 07/16/2024 17:22    Procedures Procedures (including critical care time)  Medications Ordered in UC Medications - No data to display  Initial Impression / Assessment and Plan / UC Course  I have reviewed the triage vital signs and the nursing notes.  Pertinent labs & imaging results that were available during my care of the patient were reviewed by me and considered in my medical decision  making (see chart for details).    Final  Clinical Impressions(s) / UC Diagnoses   Final diagnoses:  Closed displaced fracture of middle phalanx of lesser toe of right foot, initial encounter     Discharge Instructions      Today you have been diagnosed with a musculoskeletal injury. You should use ice on affected area for 20 minutes at a time a couple times a day for the first 24 hours then you may switch to heat in the same intervals.  Be sure to put a barrier between ice or heat source and skin to prevent burns.  May also wrap affected area and Ace bandage if tolerated and appropriate, and elevate above the level of the heart to help reduce swelling.  Do not wrap Ace bandages around neck or torso as wrapping too tight can restrict air movement inability to breathe.  If symptoms do not seem to be improving in 3 to 5 days after following these instructions we need to follow-up with orthopedist or PCP.     ED Prescriptions     Medication Sig Dispense Auth. Provider   traMADol  (ULTRAM ) 50 MG tablet Take 1 tablet (50 mg total) by mouth every 6 (six) hours as needed. 15 tablet Andra Corean BROCKS, PA-C      I have reviewed the PDMP during this encounter.   Andra Corean BROCKS, PA-C 07/16/24 1732

## 2024-07-16 NOTE — Discharge Instructions (Addendum)

## 2024-08-31 ENCOUNTER — Other Ambulatory Visit: Payer: Self-pay

## 2024-08-31 ENCOUNTER — Ambulatory Visit: Admitting: Surgical

## 2024-08-31 DIAGNOSIS — M79671 Pain in right foot: Secondary | ICD-10-CM
